# Patient Record
Sex: Male | Born: 1940 | Race: Black or African American | Hispanic: No | Marital: Married | Smoking: Never smoker
Health system: Southern US, Community
[De-identification: ages and names within clinical notes are randomized; demographics above are authoritative.]

## PROBLEM LIST (undated history)

## (undated) DIAGNOSIS — F0390 Unspecified dementia without behavioral disturbance: Secondary | ICD-10-CM

## (undated) DIAGNOSIS — E785 Hyperlipidemia, unspecified: Secondary | ICD-10-CM

## (undated) DIAGNOSIS — H269 Unspecified cataract: Secondary | ICD-10-CM

## (undated) DIAGNOSIS — H409 Unspecified glaucoma: Secondary | ICD-10-CM

## (undated) HISTORY — DX: Hyperlipidemia, unspecified: E78.5

## (undated) HISTORY — PX: HERNIA REPAIR: SHX51

## (undated) HISTORY — PX: CATARACT EXTRACTION: SUR2

## (undated) HISTORY — PX: COLONOSCOPY: SHX174

## (undated) HISTORY — DX: Unspecified glaucoma: H40.9

## (undated) HISTORY — DX: Unspecified cataract: H26.9

---

## 1997-07-27 ENCOUNTER — Inpatient Hospital Stay (HOSPITAL_COMMUNITY): Admission: EM | Admit: 1997-07-27 | Discharge: 1997-07-29 | Payer: Self-pay | Admitting: Emergency Medicine

## 1997-11-02 ENCOUNTER — Emergency Department (HOSPITAL_COMMUNITY): Admission: EM | Admit: 1997-11-02 | Discharge: 1997-11-02 | Payer: Self-pay | Admitting: Emergency Medicine

## 1999-02-13 ENCOUNTER — Emergency Department (HOSPITAL_COMMUNITY): Admission: EM | Admit: 1999-02-13 | Discharge: 1999-02-13 | Payer: Self-pay | Admitting: *Deleted

## 1999-02-13 ENCOUNTER — Encounter: Payer: Self-pay | Admitting: *Deleted

## 2001-06-24 ENCOUNTER — Encounter: Admission: RE | Admit: 2001-06-24 | Discharge: 2001-07-08 | Payer: Self-pay | Admitting: Family Medicine

## 2001-11-14 ENCOUNTER — Encounter: Admission: RE | Admit: 2001-11-14 | Discharge: 2001-11-14 | Payer: Self-pay | Admitting: Family Medicine

## 2001-11-14 ENCOUNTER — Encounter: Payer: Self-pay | Admitting: Family Medicine

## 2004-01-22 ENCOUNTER — Encounter: Admission: RE | Admit: 2004-01-22 | Discharge: 2004-01-22 | Payer: Self-pay | Admitting: Family Medicine

## 2004-04-29 ENCOUNTER — Ambulatory Visit: Payer: Self-pay | Admitting: Family Medicine

## 2004-06-16 ENCOUNTER — Ambulatory Visit: Payer: Self-pay | Admitting: Gastroenterology

## 2004-06-30 ENCOUNTER — Ambulatory Visit: Payer: Self-pay | Admitting: Gastroenterology

## 2004-09-14 ENCOUNTER — Ambulatory Visit (HOSPITAL_COMMUNITY): Admission: RE | Admit: 2004-09-14 | Discharge: 2004-09-14 | Payer: Self-pay | Admitting: Emergency Medicine

## 2004-09-14 ENCOUNTER — Emergency Department (HOSPITAL_COMMUNITY): Admission: EM | Admit: 2004-09-14 | Discharge: 2004-09-14 | Payer: Self-pay | Admitting: Emergency Medicine

## 2004-09-21 ENCOUNTER — Emergency Department (HOSPITAL_COMMUNITY): Admission: AD | Admit: 2004-09-21 | Discharge: 2004-09-21 | Payer: Self-pay | Admitting: Emergency Medicine

## 2004-10-02 ENCOUNTER — Ambulatory Visit: Payer: Self-pay | Admitting: Family Medicine

## 2004-10-13 ENCOUNTER — Ambulatory Visit: Payer: Self-pay | Admitting: Family Medicine

## 2005-04-13 ENCOUNTER — Encounter: Payer: Self-pay | Admitting: Family Medicine

## 2005-04-13 LAB — CONVERTED CEMR LAB

## 2005-11-13 ENCOUNTER — Ambulatory Visit: Payer: Self-pay | Admitting: Family Medicine

## 2005-11-20 ENCOUNTER — Ambulatory Visit: Payer: Self-pay | Admitting: Family Medicine

## 2005-11-30 ENCOUNTER — Ambulatory Visit: Payer: Self-pay | Admitting: Family Medicine

## 2006-11-12 ENCOUNTER — Encounter: Payer: Self-pay | Admitting: Family Medicine

## 2006-11-12 DIAGNOSIS — E785 Hyperlipidemia, unspecified: Secondary | ICD-10-CM | POA: Insufficient documentation

## 2006-11-12 DIAGNOSIS — J309 Allergic rhinitis, unspecified: Secondary | ICD-10-CM | POA: Insufficient documentation

## 2006-11-12 DIAGNOSIS — F528 Other sexual dysfunction not due to a substance or known physiological condition: Secondary | ICD-10-CM

## 2006-11-22 ENCOUNTER — Ambulatory Visit: Payer: Self-pay | Admitting: Family Medicine

## 2006-11-22 LAB — CONVERTED CEMR LAB
ALT: 23 units/L (ref 0–53)
AST: 27 units/L (ref 0–37)
Albumin: 3.8 g/dL (ref 3.5–5.2)
Alkaline Phosphatase: 40 units/L (ref 39–117)
BUN: 14 mg/dL (ref 6–23)
Basophils Absolute: 0 10*3/uL (ref 0.0–0.1)
Basophils Relative: 0.5 % (ref 0.0–1.0)
Bilirubin Urine: NEGATIVE
Bilirubin, Direct: 0.1 mg/dL (ref 0.0–0.3)
Blood in Urine, dipstick: NEGATIVE
CO2: 29 meq/L (ref 19–32)
Calcium: 8.9 mg/dL (ref 8.4–10.5)
Chloride: 105 meq/L (ref 96–112)
Cholesterol: 225 mg/dL (ref 0–200)
Creatinine, Ser: 0.9 mg/dL (ref 0.4–1.5)
Direct LDL: 159.1 mg/dL
Eosinophils Absolute: 0.1 10*3/uL (ref 0.0–0.6)
Eosinophils Relative: 2.5 % (ref 0.0–5.0)
GFR calc Af Amer: 109 mL/min
GFR calc non Af Amer: 90 mL/min
Glucose, Bld: 100 mg/dL — ABNORMAL HIGH (ref 70–99)
Glucose, Urine, Semiquant: NEGATIVE
HCT: 38.2 % — ABNORMAL LOW (ref 39.0–52.0)
HDL: 45.1 mg/dL (ref >39.0)
Hemoglobin: 13.4 g/dL (ref 13.0–17.0)
Ketones, urine, test strip: NEGATIVE
Lymphocytes Relative: 36.7 % (ref 12.0–46.0)
MCHC: 35.1 g/dL (ref 30.0–36.0)
MCV: 84.1 fL (ref 78.0–100.0)
Monocytes Absolute: 0.5 10*3/uL (ref 0.2–0.7)
Monocytes Relative: 10 % (ref 3.0–11.0)
Neutro Abs: 2.7 10*3/uL (ref 1.4–7.7)
Neutrophils Relative %: 50.3 % (ref 43.0–77.0)
Nitrite: NEGATIVE
PSA: 1.5 ng/mL (ref 0.10–4.00)
Platelets: 227 10*3/uL (ref 150–400)
Potassium: 4.2 meq/L (ref 3.5–5.1)
Protein, U semiquant: NEGATIVE
RBC: 4.54 M/uL (ref 4.22–5.81)
RDW: 13.1 % (ref 11.5–14.6)
Sodium: 140 meq/L (ref 135–145)
Specific Gravity, Urine: 1.02
TSH: 3.26 microintl units/mL (ref 0.35–5.50)
Total Bilirubin: 1.1 mg/dL (ref 0.3–1.2)
Total CHOL/HDL Ratio: 5
Total Protein: 6.8 g/dL (ref 6.0–8.3)
Triglycerides: 138 mg/dL (ref 0–149)
Urobilinogen, UA: 0.2
VLDL: 28 mg/dL (ref 0–40)
WBC Urine, dipstick: NEGATIVE
WBC: 5.2 10*3/uL (ref 4.5–10.5)
pH: 5.5

## 2006-12-07 ENCOUNTER — Ambulatory Visit: Payer: Self-pay | Admitting: Family Medicine

## 2007-12-06 ENCOUNTER — Ambulatory Visit: Payer: Self-pay | Admitting: Family Medicine

## 2007-12-06 LAB — CONVERTED CEMR LAB
ALT: 21 units/L (ref 0–53)
Basophils Absolute: 0 10*3/uL (ref 0.0–0.1)
Basophils Relative: 0.4 % (ref 0.0–3.0)
Bilirubin Urine: NEGATIVE
Blood in Urine, dipstick: NEGATIVE
Calcium: 9.4 mg/dL (ref 8.4–10.5)
Cholesterol: 266 mg/dL (ref 0–200)
Creatinine, Ser: 1 mg/dL (ref 0.4–1.5)
Direct LDL: 139.5 mg/dL
Eosinophils Absolute: 0.2 10*3/uL (ref 0.0–0.7)
GFR calc Af Amer: 96 mL/min
GFR calc non Af Amer: 79 mL/min
Glucose, Urine, Semiquant: NEGATIVE
HDL: 37.4 mg/dL — ABNORMAL LOW (ref >39.0)
Hemoglobin: 14.2 g/dL (ref 13.0–17.0)
Ketones, urine, test strip: NEGATIVE
MCHC: 35 g/dL (ref 30.0–36.0)
MCV: 85.9 fL (ref 78.0–100.0)
Neutro Abs: 3.7 10*3/uL (ref 1.4–7.7)
Nitrite: NEGATIVE
PSA: 1.9 ng/mL (ref 0.10–4.00)
Protein, U semiquant: NEGATIVE
RBC: 4.74 M/uL (ref 4.22–5.81)
RDW: 13.9 % (ref 11.5–14.6)
Sodium: 141 meq/L (ref 135–145)
Specific Gravity, Urine: 1.02
TSH: 2.87 microintl units/mL (ref 0.35–5.50)
Total Bilirubin: 1 mg/dL (ref 0.3–1.2)
Urobilinogen, UA: 0.2
WBC Urine, dipstick: NEGATIVE
pH: 5

## 2007-12-12 ENCOUNTER — Ambulatory Visit: Payer: Self-pay | Admitting: Family Medicine

## 2008-12-18 ENCOUNTER — Ambulatory Visit: Payer: Self-pay | Admitting: Family Medicine

## 2008-12-21 ENCOUNTER — Telehealth: Payer: Self-pay | Admitting: *Deleted

## 2009-02-25 ENCOUNTER — Ambulatory Visit: Payer: Self-pay | Admitting: Family Medicine

## 2009-02-25 LAB — CONVERTED CEMR LAB
ALT: 26 units/L (ref 0–53)
AST: 27 units/L (ref 0–37)
Bilirubin, Direct: 0.1 mg/dL (ref 0.0–0.3)
Total Bilirubin: 1.3 mg/dL — ABNORMAL HIGH (ref 0.3–1.2)

## 2009-03-04 ENCOUNTER — Ambulatory Visit: Payer: Self-pay | Admitting: Family Medicine

## 2009-05-22 ENCOUNTER — Encounter (INDEPENDENT_AMBULATORY_CARE_PROVIDER_SITE_OTHER): Payer: Self-pay | Admitting: *Deleted

## 2009-05-24 ENCOUNTER — Encounter (INDEPENDENT_AMBULATORY_CARE_PROVIDER_SITE_OTHER): Payer: Self-pay | Admitting: *Deleted

## 2009-06-18 ENCOUNTER — Encounter (INDEPENDENT_AMBULATORY_CARE_PROVIDER_SITE_OTHER): Payer: Self-pay | Admitting: *Deleted

## 2009-06-19 ENCOUNTER — Ambulatory Visit: Payer: Self-pay | Admitting: Gastroenterology

## 2009-07-03 ENCOUNTER — Ambulatory Visit: Payer: Self-pay | Admitting: Gastroenterology

## 2009-07-04 ENCOUNTER — Encounter: Payer: Self-pay | Admitting: Gastroenterology

## 2009-12-23 ENCOUNTER — Ambulatory Visit: Payer: Self-pay | Admitting: Family Medicine

## 2010-05-11 LAB — CONVERTED CEMR LAB
ALT: 21 units/L (ref 0–53)
ALT: 22 units/L (ref 0–53)
AST: 28 units/L (ref 0–37)
AST: 31 units/L (ref 0–37)
Albumin: 4.3 g/dL (ref 3.5–5.2)
Alkaline Phosphatase: 40 units/L (ref 39–117)
Alkaline Phosphatase: 44 units/L (ref 39–117)
BUN: 11 mg/dL (ref 6–23)
Basophils Absolute: 0 10*3/uL (ref 0.0–0.1)
Basophils Relative: 0.2 % (ref 0.0–3.0)
Basophils Relative: 0.9 % (ref 0.0–3.0)
Bilirubin Urine: NEGATIVE
Bilirubin, Direct: 0 mg/dL (ref 0.0–0.3)
Bilirubin, Direct: 0.2 mg/dL (ref 0.0–0.3)
CO2: 29 meq/L (ref 19–32)
Calcium: 9.4 mg/dL (ref 8.4–10.5)
Chloride: 104 meq/L (ref 96–112)
Chloride: 106 meq/L (ref 96–112)
Cholesterol: 238 mg/dL — ABNORMAL HIGH (ref 0–200)
Creatinine, Ser: 0.9 mg/dL (ref 0.4–1.5)
Creatinine, Ser: 1 mg/dL (ref 0.4–1.5)
Direct LDL: 172.7 mg/dL
Eosinophils Absolute: 0.1 10*3/uL (ref 0.0–0.7)
Eosinophils Relative: 1.7 % (ref 0.0–5.0)
Eosinophils Relative: 2 % (ref 0.0–5.0)
GFR calc non Af Amer: 108.22 mL/min (ref >60)
Glucose, Bld: 96 mg/dL (ref 70–99)
Glucose, Urine, Semiquant: NEGATIVE
HCT: 41.5 % (ref 39.0–52.0)
HDL: 49.7 mg/dL (ref >39.00)
Hemoglobin: 14.2 g/dL (ref 13.0–17.0)
LDL Cholesterol: 96 mg/dL (ref 0–99)
Lymphocytes Relative: 26 % (ref 12.0–46.0)
Lymphs Abs: 1.6 10*3/uL (ref 0.7–4.0)
MCHC: 34.2 g/dL (ref 30.0–36.0)
MCV: 86.9 fL (ref 78.0–100.0)
MCV: 87.3 fL (ref 78.0–100.0)
Monocytes Absolute: 0.4 10*3/uL (ref 0.1–1.0)
Monocytes Absolute: 0.6 10*3/uL (ref 0.1–1.0)
Monocytes Relative: 6.9 % (ref 3.0–12.0)
Monocytes Relative: 9.2 % (ref 3.0–12.0)
Neutro Abs: 4.2 10*3/uL (ref 1.4–7.7)
Neutrophils Relative %: 58.2 % (ref 43.0–77.0)
Neutrophils Relative %: 65.2 % (ref 43.0–77.0)
PSA: 1.5 ng/mL (ref 0.10–4.00)
PSA: 1.7 ng/mL (ref 0.10–4.00)
Platelets: 254 10*3/uL (ref 150.0–400.0)
Potassium: 4.1 meq/L (ref 3.5–5.1)
Potassium: 4.9 meq/L (ref 3.5–5.1)
RBC: 4.47 M/uL (ref 4.22–5.81)
RBC: 4.77 M/uL (ref 4.22–5.81)
RDW: 13.3 % (ref 11.5–14.6)
Sodium: 140 meq/L (ref 135–145)
Specific Gravity, Urine: 1.025
TSH: 1.52 microintl units/mL (ref 0.35–5.50)
Total Bilirubin: 1.6 mg/dL — ABNORMAL HIGH (ref 0.3–1.2)
Total CHOL/HDL Ratio: 3
Total CHOL/HDL Ratio: 5
Total Protein: 6.8 g/dL (ref 6.0–8.3)
Total Protein: 7.2 g/dL (ref 6.0–8.3)
Triglycerides: 121 mg/dL (ref 0.0–149.0)
Triglycerides: 85 mg/dL (ref 0.0–149.0)
Urobilinogen, UA: 0.2
VLDL: 24.2 mg/dL (ref 0.0–40.0)
WBC: 6 10*3/uL (ref 4.5–10.5)
WBC: 6.3 10*3/uL (ref 4.5–10.5)

## 2010-05-13 NOTE — Letter (Signed)
Summary: Patient Notice- Polyp Results  [facility]  [address] [place], [place] [address]   Phone: [phone]  Fax: [phone]        July 04, 2009 MRN: [id number]    Tony Leach [address] [place], [place]  [address]    Dear Mr. OLANDER,  I am pleased to inform you that the colon polyp(s) removed during your recent colonoscopy was (were) found to be benign (no cancer detected) upon pathologic examination.  I recommend you have a repeat colonoscopy examination in 5 years to look for recurrent polyps, as having colon polyps increases your risk for having recurrent polyps or even colon cancer in the future.  Should you develop new or worsening symptoms of abdominal pain, bowel habit changes or bleeding from the rectum or bowels, please schedule an evaluation with either your primary care physician or with me.  Continue treatment plan as outlined the day of your exam.  Please call us if you are having persistent problems or have questions about your condition that have not been fully answered at this time.  Sincerely,  Meryl Dare MD [facility]  This letter has been electronically signed by your physician.  Appended Document: Patient Notice- Polyp Results Letter mailed 3.25.11

## 2010-05-13 NOTE — Letter (Signed)
Summary: Colonoscopy Letter  [facility]  [address] [place], [place] [address]   Phone: [phone]  Fax: [phone]      May 22, 2009 MRN: [id number]   Tony Leach [address] [place], [place]  [address]   Dear [name],   According to your medical record, it is time for you to schedule a Colonoscopy. The American Cancer Society recommends this procedure as a method to detect early colon cancer. Patients with a family history of colon cancer, or a personal history of colon polyps or inflammatory bowel disease are at increased risk.  This letter has beeen generated based on the recommendations made at the time of your procedure. If you feel that in your particular situation this may no longer apply, please contact our office.  Please call our office at [phone] to schedule this appointment or to update your records at your earliest convenience.  Thank you for cooperating with [place] to provide you with the very best care possible.   Sincerely,  Judie Petit T. Russella Dar, M.D.  [facility] Gastroenterology Division [phone]

## 2010-05-13 NOTE — Procedures (Signed)
Summary: Colonoscopy  Patient: Tony Leach Note: All result statuses are Final unless otherwise noted.  Tests: (1) Colonoscopy (COL)   COL Colonoscopy           DONE     [facility]     [address]     [place], [place]  [address]           COLONOSCOPY PROCEDURE REPORT           PATIENT:  Tony Leach, Tony Leach  MR#:  [id number]     BIRTHDATE:  1940/07/08, 68 yrs. old  GENDER:  male           ENDOSCOPIST:  Judie Petit T. Russella Dar, MD, [facility]           PROCEDURE DATE:  07/03/2009     PROCEDURE:  Colonoscopy with snare polypectomy     ASA CLASS:  Class II     INDICATIONS:  1) follow-up of polyp  2) family history of colon     cancer, adenomatous polyp, 06/2004. Brother with colon cancer at     42. Screening.           MEDICATIONS:   Fentanyl 75 mcg IV, Versed 7 mg IV           DESCRIPTION OF PROCEDURE:   After the risks benefits and     alternatives of the procedure were thoroughly explained, informed     consent was obtained.  Digital rectal exam was performed and     revealed no abnormalities.   The LB [id number]-[id number] endoscope     was introduced through the anus and advanced to the cecum, which     was identified by both the appendix and ileocecal valve, without     limitations.  The quality of the prep was excellent, using     MoviPrep.  The instrument was then slowly withdrawn as the colon     was fully examined.     <<PROCEDUREIMAGES>>           FINDINGS:  A sessile polyp was found in the ascending colon. It     was 5 mm in size. Polyp was snared without cautery. Retrieval was     successful.  A sessile polyp was found in the descending colon. It     was 6 mm in size. Polyp was snared without cautery. Retrieval was     successful. A sessile polyp was found in the sigmoid colon. It was     4 mm in size. Polyp was snared without cautery. Retrieval was     successful. This was otherwise a normal examination of the colon.     Retroflexed views in the rectum revealed internal  hemorrhoids,     small. The time to cecum =  1.33  minutes. The scope was then     withdrawn (time =  10.5  min) from the patient and the procedure     completed.           COMPLICATIONS:  None           ENDOSCOPIC IMPRESSION:     1) 5 mm sessile polyp in the ascending colon     2) 6 mm sessile polyp in the descending colon     3) 4 mm sessile polyp in the sigmoid colon     4) Internal hemorrhoids           RECOMMENDATIONS:     1) Await pathology results  2) Repeat Colonoscopy in 5 years.           Venita Lick. Russella Dar, MD, [facility]           CC: Roderick Pee, MD           n.     Rosalie DoctorVenita Lick. Sible Straley at 07/03/2009 10:04 AM           Antoine Primas, [id number]  Note: An exclamation mark (!) indicates a result that was not dispersed into the flowsheet. Document Creation Date: 07/03/2009 10:04 AM _______________________________________________________________________  (1) Order result status: Final Collection or observation date-time: 07/03/2009 10:00 Requested date-time:  Receipt date-time:  Reported date-time:  Referring Physician:   Ordering Physician: Claudette Head [phone]) Specimen Source:  Source: Launa Grill Order Number: [id number] Lab site:   Appended Document: Colonoscopy     Procedures Next Due Date:    Colonoscopy: 06/2014

## 2010-05-13 NOTE — Letter (Signed)
Summary: Previsit letter  [facility]  [address] [place], [place] [address]   Phone: [phone]  Fax: [phone]       05/24/2009 MRN: [id number]  Tony Leach [address] [place], [place]  [address]  Dear Mr. PILLSBURY,  Welcome to the Gastroenterology Division at [facility].    You are scheduled to see a nurse for your pre-procedure visit on 06-19-09 at 9:30am on the 3rd floor at [facility], [address]  We ask that you try to arrive at our office 15 minutes prior to your appointment time to allow for check-in.  Your nurse visit will consist of discussing your medical and surgical history, your immediate family medical history, and your medications.    Please bring a complete list of all your medications or, if you prefer, bring the medication bottles and we will list them.  We will need to be aware of both prescribed and over the counter drugs.  We will need to know exact dosage information as well.  If you are on blood thinners (Coumadin, Plavix, Aggrenox, Ticlid, etc.) please call our office today/prior to your appointment, as we need to consult with your physician about holding your medication.   Please be prepared to read and sign documents such as consent forms, a financial agreement, and acknowledgement forms.  If necessary, and with your consent, a friend or relative is welcome to sit-in on the nurse visit with you.  Please bring your insurance card so that we may make a copy of it.  If your insurance requires a referral to see a specialist, please bring your referral form from your primary care physician.  No co-pay is required for this nurse visit.     If you cannot keep your appointment, please call [phone] to cancel or reschedule prior to your appointment date.  This allows [place] the opportunity to schedule an appointment for another patient in need of care.    Thank you for choosing [facility] for your medical needs.  We  appreciate the opportunity to care for you.  Please visit [place] at our website  to learn more about our practice.                     Sincerely.                                                                                                                   The Gastroenterology Division

## 2010-05-13 NOTE — Assessment & Plan Note (Signed)
Summary: pt will come in fasting/njr   Vital Signs:  Patient profile:   70 year old male Height:      58.5 inches Weight:      170 pounds BMI:     35.05 Temp:     97.7 degrees F oral BP sitting:   110 / 74  (right arm) Cuff size:   regular  Vitals Entered By: Kathrynn Speed CMA (December 23, 2009 8:20 AM) CC: annual exam, fasting, src Is Patient Diabetic? No   CC:  annual exam, fasting, and src.  History of Present Illness:  Tony Leach is a 70 year old male, who comes in today for evaluation of hyperlipidemia, and erectile dysfunction, and general physical examination  He takes simvastatin 10 mg nightly for hyperlipidemia.  Will check lipid panel today.  He takes V  50 mg dose one half tab p.r.n. for erectile dysfunction.  He also takes an 81-mg baby aspirin and eyedrops to the ophthalmologist.  He gets routine eye care.  No dental care.  Colonoscopy 2011 normal, tetanus, 2000, Pneumovax 2005, declines a flu shot Here for Medicare AWV:  1.   Risk factors based on Past M, S, F history:...reviewed in detail.  No change 2.   Physical Activities: walks daily 3.   Depression/mood: good mood.  No depression 4.   Hearing: normal 5.   ADL's: normal functioning at home 6.   Fall Risk: we did none 7.   Home Safety: reviewed.  No guns in the house 8.   Height, weight, &visual acuity:height weight, normal.  Vision normal.  The ophthalmologist 9.   Counseling: continue exercise daily, and medications 10.   Labs ordered based on risk factors: done today 11.           Referral Coordination........none indicated 12.           Care Plan.........Marland Kitchenreviewed medications the patient again declined flu shot 13.            Cognitive Assessment ...Marland KitchenMarland KitchenMarland Kitchenoriented x 3.  Cognitive function, normal  Preventive Screening-Counseling & Management  Alcohol-Tobacco     Smoking Status: never  Current Medications (verified): 1)  Aspirin 81 Mg  Tbec (Aspirin) .... Once Daily 2)  Cosopt 2-0.5 %  Soln  (Dorzolamide-Timolol) .... One Drop Two Times A Day Left Eye 3)  Mens Multivitamin Plus   Tabs (Multiple Vitamins-Minerals) .... Two Times A Day 4)  Viagra 50 Mg Tabs (Sildenafil Citrate) .... Uad 5)  Simvastatin 10 Mg Tabs (Simvastatin) .... Take One Tab At Bedtime  Allergies (verified): No Known Drug Allergies  Past History:  Past medical, surgical, family and social histories (including risk factors) reviewed, and no changes noted (except as noted below).  Past Medical History: Reviewed history from 11/12/2006 and no changes required. Allergic rhinitis Asthma Hyperlipidemia erectile dysfunction  Past Surgical History: Reviewed history from 11/12/2006 and no changes required. Inguinal herniorrhaphy R  Family History: Reviewed history and no changes required.  Social History: Reviewed history from 12/12/2007 and no changes required. Retired Never Smoked Alcohol use-no Drug use-no Regular exercise-yes  Review of Systems      See HPI  Physical Exam  General:  Well-developed,well-nourished,in no acute distress; alert,appropriate and cooperative throughout examination Head:  Normocephalic and atraumatic without obvious abnormalities. No apparent alopecia or balding. Eyes:  No corneal or conjunctival inflammation noted. EOMI. Perrla. Funduscopic exam benign, without hemorrhages, exudates or papilledema. Vision grossly normal. Ears:  External ear exam shows no significant lesions or deformities.  Otoscopic examination reveals clear  canals, tympanic membranes are intact bilaterally without bulging, retraction, inflammation or discharge. Hearing is grossly normal bilaterally. Nose:  External nasal examination shows no deformity or inflammation. Nasal mucosa are pink and moist without lesions or exudates. Mouth:  Oral mucosa and oropharynx without lesions or exudates.  Teeth in good repair. Neck:  No deformities, masses, or tenderness noted. Chest Wall:  No deformities, masses,  tenderness or gynecomastia noted. Breasts:  No masses or gynecomastia noted Lungs:  Normal respiratory effort, chest expands symmetrically. Lungs are clear to auscultation, no crackles or wheezes. Heart:  Normal rate and regular rhythm. S1 and S2 normal without gallop, murmur, click, rub or other extra sounds. Abdomen:  Bowel sounds positive,abdomen soft and non-tender without masses, organomegaly or hernias noted. Rectal:  No external abnormalities noted. Normal sphincter tone. No rectal masses or tenderness. Genitalia:  Testes bilaterally descended without nodularity, tenderness or masses. No scrotal masses or lesions. No penis lesions or urethral discharge. Prostate:  Prostate gland firm and smooth, no enlargement, nodularity, tenderness, mass, asymmetry or induration. Msk:  No deformity or scoliosis noted of thoracic or lumbar spine.   Pulses:  R and L carotid,radial,femoral,dorsalis pedis and posterior tibial pulses are full and equal bilaterally Extremities:  No clubbing, cyanosis, edema, or deformity noted with normal full range of motion of all joints.   Neurologic:  No cranial nerve deficits noted. Station and gait are normal. Plantar reflexes are down-going bilaterally. DTRs are symmetrical throughout. Sensory, motor and coordinative functions appear intact. Skin:  Intact without suspicious lesions or rashes Cervical Nodes:  No lymphadenopathy noted Axillary Nodes:  No palpable lymphadenopathy Inguinal Nodes:  No significant adenopathy Psych:  Cognition and judgment appear intact. Alert and cooperative with normal attention span and concentration. No apparent delusions, illusions, hallucinations   Impression & Recommendations:  Problem # 1:  HEALTH SCREENING (ICD-V70.0) Assessment Unchanged  Orders: EKG w/ Interpretation ([address]) Venipuncture ([address]) TLB-Lipid Panel (80061-LIPID) TLB-BMP (Basic Metabolic Panel-BMET) (80048-METABOL) TLB-CBC Platelet - w/Differential  (85025-CBCD) TLB-Hepatic/Liver Function Pnl (80076-HEPATIC) TLB-TSH (Thyroid Stimulating Hormone) (84443-TSH) TLB-PSA (Prostate Specific Antigen) (84153-PSA) Prescription Created Electronically [phone]) Medicare -1st Annual Wellness Visit [phone]) Urinalysis-dipstick only (Medicare patient) (91478GN) Specimen Handling ([address])  Problem # 2:  ERECTILE DYSFUNCTION (ICD-302.72) Assessment: Improved  His updated medication list for this problem includes:    Viagra 50 Mg Tabs (Sildenafil citrate) ..... Uad  Orders: Venipuncture ([address]) TLB-Lipid Panel (80061-LIPID) TLB-BMP (Basic Metabolic Panel-BMET) (80048-METABOL) TLB-CBC Platelet - w/Differential (85025-CBCD) TLB-Hepatic/Liver Function Pnl (80076-HEPATIC) TLB-TSH (Thyroid Stimulating Hormone) (84443-TSH) TLB-PSA (Prostate Specific Antigen) (84153-PSA) Prescription Created Electronically [phone]) Medicare -1st Annual Wellness Visit [phone]) Urinalysis-dipstick only (Medicare patient) (41324MW) Specimen Handling ([address])  Problem # 3:  HYPERLIPIDEMIA (ICD-272.4) Assessment: Improved  His updated medication list for this problem includes:    Simvastatin 10 Mg Tabs (Simvastatin) .Marland Kitchen... Take one tab at bedtime  Orders: EKG w/ Interpretation (93000) Venipuncture (53664) TLB-Lipid Panel (80061-LIPID) TLB-BMP (Basic Metabolic Panel-BMET) (80048-METABOL) TLB-CBC Platelet - w/Differential (85025-CBCD) TLB-Hepatic/Liver Function Pnl (80076-HEPATIC) TLB-TSH (Thyroid Stimulating Hormone) (84443-TSH) TLB-PSA (Prostate Specific Antigen) (84153-PSA) Prescription Created Electronically [phone]) Medicare -1st Annual Wellness Visit [phone]) Urinalysis-dipstick only (Medicare patient) (63875IE) Specimen Handling ([address]95)  Complete Medication List: 1)  Aspirin 81 Mg Tbec (Aspirin) .... Once daily 2)  Cosopt 2-0.5 % Soln (Dorzolamide-timolol) .... One drop two times a day left eye 3)  Mens Multivitamin Plus Tabs (Multiple vitamins-minerals) ....  Two times a day 4)  Viagra 50 Mg Tabs (Sildenafil citrate) .... Uad 5)  Simvastatin 10 Mg Tabs (Simvastatin) .... Take  one tab at bedtime  Patient Instructions: 1)  Please schedule a follow-up appointment in 1 year. Prescriptions: SIMVASTATIN 10 MG TABS (SIMVASTATIN) take one tab at bedtime  #100 x 3   Entered and Authorized by:   Roderick Pee MD   Signed by:   Roderick Pee MD on 12/23/2009   Method used:   Electronically to        [facility]       [address]       [place], [place]  [address]       Ph: [phone]       Fax: [phone]   RxID:   [id number] VIAGRA 50 MG TABS (SILDENAFIL CITRATE) UAD  #6 x 11   Entered and Authorized by:   Roderick Pee MD   Signed by:   Roderick Pee MD on 12/23/2009   Method used:   Electronically to        [facility]       [address]       [place], [place]  [address]       Ph: [phone]       Fax: [phone]   RxID:   [id number]     Appended Document: Orders Update    Clinical Lists Changes  Observations: Added new observation of COMMENTS: Wynona Canes, CMA  December 23, 2009 11:48 AM  (12/23/2009 11:47) Added new observation of PH URINE: 5.0  (12/23/2009 11:47) Added new observation of SPEC GR URIN: 1.025  (12/23/2009 11:47) Added new observation of APPEARANCE U: Clear  (12/23/2009 11:47) Added new observation of UA COLOR: yellow  (12/23/2009 11:47) Added new observation of WBC DIPSTK U: negative  (12/23/2009 11:47) Added new observation of NITRITE URN: negative  (12/23/2009 11:47) Added new observation of UROBILINOGEN: 0.2  (12/23/2009 11:47) Added new observation of PROTEIN, URN: negative  (12/23/2009 11:47) Added new observation of BLOOD UR DIP: negative  (12/23/2009 11:47) Added new observation of KETONES URN: negative  (12/23/2009 11:47) Added new observation of BILIRUBIN UR: negative  (12/23/2009  11:47) Added new observation of GLUCOSE, URN: negative  (12/23/2009 11:47)      Laboratory Results   Urine Tests  Date/Time Recieved: December 23, 2009 11:48 AM  Date/Time Reported: December 23, 2009 11:48 AM   Routine Urinalysis   Color: yellow Appearance: Clear Glucose: negative   (Normal Range: Negative) Bilirubin: negative   (Normal Range: Negative) Ketone: negative   (Normal Range: Negative) Spec. Gravity: 1.025   (Normal Range: 1.003-1.035) Blood: negative   (Normal Range: Negative) pH: 5.0   (Normal Range: 5.0-8.0) Protein: negative   (Normal Range: Negative) Urobilinogen: 0.2   (Normal Range: 0-1) Nitrite: negative   (Normal Range: Negative) Leukocyte Esterace: negative   (Normal Range: Negative)    Comments: Wynona Canes, CMA  December 23, 2009 11:48 AM

## 2010-05-13 NOTE — Miscellaneous (Signed)
Summary: [facility]  Clinical Lists Changes  Medications: Added new medication of MOVIPREP 100 GM  SOLR (PEG-KCL-NACL-NASULF-NA ASC-C) As per prep instructions. - Signed Rx of MOVIPREP 100 GM  SOLR (PEG-KCL-NACL-NASULF-NA ASC-C) As per prep instructions.;  #1 x 0;  Signed;  Entered by: Ezra Sites RN;  Authorized by: Meryl Dare MD [facility];  Method used: Electronically to [facility] [address], [place], [place], [place]  [address], Ph: [phone], Fax: [phone] Observations: Added new observation of NKA: T (06/19/2009 9:02)    Prescriptions: MOVIPREP 100 GM  SOLR (PEG-KCL-NACL-NASULF-NA ASC-C) As per prep instructions.  #1 x 0   Entered by:   Ezra Sites RN   Authorized by:   Meryl Dare MD [facility]   Signed by:   Ezra Sites RN on 06/19/2009   Method used:   Electronically to        [facility]       [address]       [place], [place]  [address]       Ph: [phone]       Fax: [phone]   RxID:   [id number]

## 2010-05-13 NOTE — Letter (Signed)
Summary: [facility] Instructions  [facility]  [address] [place], [place] [address]   Phone: [phone]  Fax: [phone]       Tony Leach    March 23, 1941    MRN: [id number]        Procedure Day Dorna Bloom:  Wednesday  07/03/09     Arrival Time:  8:30am     Procedure Time:  9:30am     Location of Procedure:                    _X _  [facility] (4th Floor)                        PREPARATION FOR COLONOSCOPY WITH MOVIPREP   Starting 5 days prior to your procedure  Friday 03/18  do not eat nuts, seeds, popcorn, corn, beans, peas,  salads, or any raw vegetables.  Do not take any fiber supplements (e.g. Metamucil, Citrucel, and Benefiber).  THE DAY BEFORE YOUR PROCEDURE         DATE: 03/22   DAY: Tuesday  1.  Drink clear liquids the entire day-NO SOLID FOOD  2.  Do not drink anything colored red or purple.  Avoid juices with pulp.  No orange juice.  3.  Drink at least 64 oz. (8 glasses) of fluid/clear liquids during the day to prevent dehydration and help the prep work efficiently.  CLEAR LIQUIDS INCLUDE: Water Jello Ice Popsicles Tea (sugar ok, no milk/cream) Powdered fruit flavored drinks Coffee (sugar ok, no milk/cream) Gatorade Juice: apple, white grape, white cranberry  Lemonade Clear bullion, consomm, broth Carbonated beverages (any kind) Strained chicken noodle soup Hard Candy                             4.  In the morning, mix first dose of MoviPrep solution:    Empty 1 Pouch A and 1 Pouch B into the disposable container    Add lukewarm drinking water to the top line of the container. Mix to dissolve    Refrigerate (mixed solution should be used within 24 hrs)  5.  Begin drinking the prep at 5:00 p.m. The MoviPrep container is divided by 4 marks.   Every 15 minutes drink the solution down to the next mark (approximately 8 oz) until the full liter is complete.   6.  Follow completed prep with 16 oz of clear liquid of your  choice (Nothing red or purple).  Continue to drink clear liquids until bedtime.  7.  Before going to bed, mix second dose of MoviPrep solution:    Empty 1 Pouch A and 1 Pouch B into the disposable container    Add lukewarm drinking water to the top line of the container. Mix to dissolve    Refrigerate  THE DAY OF YOUR PROCEDURE      DATE: 03/23  DAY: Wednesday  Beginning at  4:30 a.m. (5 hours before procedure):         1. Every 15 minutes, drink the solution down to the next mark (approx 8 oz) until the full liter is complete.  2. Follow completed prep with 16 oz. of clear liquid of your choice.    3. You may drink clear liquids until 7:30am  (2 HOURS BEFORE PROCEDURE).   MEDICATION INSTRUCTIONS  Unless otherwise instructed, you should take regular prescription medications with a small sip of water  as early as possible the morning of your procedure.        OTHER INSTRUCTIONS  You will need a responsible adult at least 69 years of age to accompany you and drive you home.   This person must remain in the waiting room during your procedure.  Wear loose fitting clothing that is easily removed.  Leave jewelry and other valuables at home.  However, you may wish to bring a book to read or  an iPod/MP3 player to listen to music as you wait for your procedure to start.  Remove all body piercing jewelry and leave at home.  Total time from sign-in until discharge is approximately 2-3 hours.  You should go home directly after your procedure and rest.  You can resume normal activities the  day after your procedure.  The day of your procedure you should not:   Drive   Make legal decisions   Operate machinery   Drink alcohol   Return to work  You will receive specific instructions about eating, activities and medications before you leave.    The above instructions have been reviewed and explained to me by   Ezra Sites RN  June 19, 2009 9:16 AM     I fully  understand and can verbalize these instructions _____________________________ Date _________

## 2010-05-13 NOTE — Assessment & Plan Note (Signed)
Summary: emp/pt coming in fasting/jls   Vital Signs:  Patient Profile:   70 Years Old Male Height:     58.5 inches Weight:      175 pounds Temp:     97.8 degrees F oral Pulse rate:   58 / minute Pulse rhythm:   regular BP sitting:   124 / 80  (left arm) Cuff size:   regular  Vitals Entered By: Kern Reap CMA (December 12, 2007 10:30 AM)                 Chief Complaint:  cpx.  History of Present Illness: Tony Leach is a 70 year old, retired male, who comes in today for physical evaluation because of a history of hyperlipidemia and erectile dysfunction.  Erectile dysfunction.  Uses fiber 50 mg one half tablet, p.r.n., it's working well.  The hyperlipidemia is minute by diet, exercise, his weight is currently 175, height 5-foot 8 1/2 inches, and he walks 30 to 45 minutes daily.  He also takes an 81-mg baby aspirin.  He gets his eyes checked every 6 months.  No dental care, because he has no dental insurance: : Check was 2006 tetanus 08.  pneumonia vaccine0 5.  He states he doesn't get the flu shot.  He was advised to do that    Prior Medication List:  ASPIRIN 81 MG  TBEC (ASPIRIN) once daily COSOPT 2-0.5 %  SOLN (DORZOLAMIDE-TIMOLOL) ONE DROP two times a day LEFT EYE MENS MULTIVITAMIN PLUS   TABS (MULTIPLE VITAMINS-MINERALS) two times a day VIAGRA 100 MG  TABS (SILDENAFIL CITRATE) uad   Current Allergies: No known allergies   Past Medical History:    Reviewed history from 11/12/2006 and no changes required:       Allergic rhinitis       Asthma       Hyperlipidemia       erectile dysfunction   Family History:    Reviewed history and no changes required:  Social History:    Reviewed history and no changes required:       Retired       Never Smoked       Alcohol use-no       Drug use-no       Regular exercise-yes   Risk Factors:  Tobacco use:  never Drug use:  no Alcohol use:  no Exercise:  yes   Review of Systems      See HPI   Physical  Exam  General:     Well-developed,well-nourished,in no acute distress; alert,appropriate and cooperative throughout examination Head:     Normocephalic and atraumatic without obvious abnormalities. No apparent alopecia or balding. Eyes:     No corneal or conjunctival inflammation noted. EOMI. Perrla. Funduscopic exam benign, without hemorrhages, exudates or papilledema. Vision grossly normal. Ears:     External ear exam shows no significant lesions or deformities.  Otoscopic examination reveals clear canals, tympanic membranes are intact bilaterally without bulging, retraction, inflammation or discharge. Hearing is grossly normal bilaterally. Nose:     External nasal examination shows no deformity or inflammation. Nasal mucosa are pink and moist without lesions or exudates. Mouth:     Oral mucosa and oropharynx without lesions or exudates.  Teeth in good repair. Neck:     No deformities, masses, or tenderness noted. Chest Wall:     No deformities, masses, tenderness or gynecomastia noted. Breasts:     No masses or gynecomastia noted Lungs:     Normal respiratory effort, chest  expands symmetrically. Lungs are clear to auscultation, no crackles or wheezes. Heart:     Normal rate and regular rhythm. S1 and S2 normal without gallop, murmur, click, rub or other extra sounds. Abdomen:     Bowel sounds positive,abdomen soft and non-tender without masses, organomegaly or hernias noted. Rectal:     No external abnormalities noted. Normal sphincter tone. No rectal masses or tenderness. Genitalia:     Testes bilaterally descended without nodularity, tenderness or masses. No scrotal masses or lesions. No penis lesions or urethral discharge. Prostate:     Prostate gland firm and smooth, no enlargement, nodularity, tenderness, mass, asymmetry or induration. Msk:     No deformity or scoliosis noted of thoracic or lumbar spine.   Pulses:     R and L carotid,radial,femoral,dorsalis pedis and  posterior tibial pulses are full and equal bilaterally Extremities:     No clubbing, cyanosis, edema, or deformity noted with normal full range of motion of all joints.   Neurologic:     No cranial nerve deficits noted. Station and gait are normal. Plantar reflexes are down-going bilaterally. DTRs are symmetrical throughout. Sensory, motor and coordinative functions appear intact. Skin:     Intact without suspicious lesions or rashes Cervical Nodes:     No lymphadenopathy noted Axillary Nodes:     No palpable lymphadenopathy Inguinal Nodes:     No significant adenopathy Psych:     Cognition and judgment appear intact. Alert and cooperative with normal attention span and concentration. No apparent delusions, illusions, hallucinations    Impression & Recommendations:  Problem # 1:  ERECTILE DYSFUNCTION (ICD-302.72) Assessment: Improved  The following medications were removed from the medication list:    Viagra 100 Mg Tabs (Sildenafil citrate) ..... Uad  His updated medication list for this problem includes:    Viagra 50 Mg Tabs (Sildenafil citrate) ..... Uad   Complete Medication List: 1)  Aspirin 81 Mg Tbec (Aspirin) .... Once daily 2)  Cosopt 2-0.5 % Soln (Dorzolamide-timolol) .... One drop two times a day left eye 3)  Mens Multivitamin Plus Tabs (Multiple vitamins-minerals) .... Two times a day 4)  Viagra 50 Mg Tabs (Sildenafil citrate) .... Uad  Other Orders: EKG w/ Interpretation (93000)   Patient Instructions: 1)  Please schedule a follow-up appointment in 1 year. 2)  It is important that you exercise regularly at least 20 minutes 5 times a week. If you develop chest pain, have severe difficulty breathing, or feel very tired , stop exercising immediately and seek medical attention. 3)  Take an Aspirin every day.   Prescriptions: VIAGRA 50 MG TABS (SILDENAFIL CITRATE) UAD  #6 x 11   Entered and Authorized by:   Roderick Pee MD   Signed by:   Roderick Pee MD on  12/12/2007   Method used:   Electronically to        [facility]       [address]       [place], [place]  [address]       Ph: [phone]       Fax: [phone]   RxID:   [id number]  ]

## 2010-12-25 ENCOUNTER — Encounter: Payer: Self-pay | Admitting: Family Medicine

## 2010-12-25 ENCOUNTER — Ambulatory Visit (INDEPENDENT_AMBULATORY_CARE_PROVIDER_SITE_OTHER): Payer: Medicare Other | Admitting: Family Medicine

## 2010-12-25 DIAGNOSIS — Z0000 Encounter for general adult medical examination without abnormal findings: Secondary | ICD-10-CM

## 2010-12-25 DIAGNOSIS — E785 Hyperlipidemia, unspecified: Secondary | ICD-10-CM

## 2010-12-25 DIAGNOSIS — F528 Other sexual dysfunction not due to a substance or known physiological condition: Secondary | ICD-10-CM

## 2010-12-25 LAB — POCT URINALYSIS DIPSTICK
Blood, UA: NEGATIVE
Glucose, UA: NEGATIVE
Nitrite, UA: NEGATIVE
Protein, UA: NEGATIVE
Spec Grav, UA: 1.025
Urobilinogen, UA: 2

## 2010-12-25 LAB — HEPATIC FUNCTION PANEL
ALT: 19 U/L (ref 0–53)
AST: 26 U/L (ref 0–37)
Alkaline Phosphatase: 46 U/L (ref 39–117)

## 2010-12-25 LAB — CBC WITH DIFFERENTIAL/PLATELET
Basophils Relative: 0.6 % (ref 0.0–3.0)
Eosinophils Absolute: 0.1 10*3/uL (ref 0.0–0.7)
HCT: 40.8 % (ref 39.0–52.0)
Hemoglobin: 13.7 g/dL (ref 13.0–17.0)
Lymphs Abs: 1.5 10*3/uL (ref 0.7–4.0)
MCHC: 33.6 g/dL (ref 30.0–36.0)
MCV: 87.2 fl (ref 78.0–100.0)
Monocytes Absolute: 0.5 10*3/uL (ref 0.1–1.0)
Neutro Abs: 3.1 10*3/uL (ref 1.4–7.7)
RBC: 4.69 Mil/uL (ref 4.22–5.81)

## 2010-12-25 LAB — BASIC METABOLIC PANEL
BUN: 18 mg/dL (ref 6–23)
GFR: 132.76 mL/min (ref >60.00)
Potassium: 4.5 mEq/L (ref 3.5–5.1)
Sodium: 139 mEq/L (ref 135–145)

## 2010-12-25 LAB — PSA: PSA: 1.48 ng/mL (ref 0.10–4.00)

## 2010-12-25 LAB — TSH: TSH: 2.07 u[IU]/mL (ref 0.35–5.50)

## 2010-12-25 LAB — LIPID PANEL
LDL Cholesterol: 109 mg/dL — ABNORMAL HIGH (ref 0–99)
Total CHOL/HDL Ratio: 3
VLDL: 13.4 mg/dL (ref 0.0–40.0)

## 2010-12-25 MED ORDER — SILDENAFIL CITRATE 100 MG PO TABS: 100.0000 mg | ORAL_TABLET | ORAL | Status: DC | PRN

## 2010-12-25 MED ORDER — SIMVASTATIN 10 MG PO TABS: 10.0000 mg | ORAL_TABLET | Freq: Every day | ORAL | Status: DC

## 2010-12-25 NOTE — Progress Notes (Signed)
  Subjective:    Patient ID: Tony Leach, male    DOB: 1940-10-26, 70 y.o.   MRN: [id number]  HPILawrence is a 70 year old, married man, nonsmoker and comes in today for Medicare wellness examination because of a history of allergic rhinitis, asthma, erectile dysfunction, and hyperlipidemia.  He takes simvastatin 10 mg nightly and aspirin tablet for hyperlipidemia.  Will check lipid panel today.  He uses fiber and 50 mg p.r.n., however, doesn't seem to be working very well.  Will increase the dose 200 mg.  He has a history of underlying allergic rhinitis and asthma.  He declines the shingles, flu and Pneumovax.  Tetanus booster 2008.  He gets routine eye care and vision is normal with his glasses.  Hearing normal, regular dental care, colonoscopy, normal.  GI, activities of daily living.  He walks on a regular basis.  Cognitive function, normal.  Home health safety reviewed.  No issues identified.  No guns in the house.  He does have a healthcare power of attorney and living will.    Review of Systems  Constitutional: Negative.   HENT: Negative.   Eyes: Negative.   Respiratory: Negative.   Cardiovascular: Negative.   Gastrointestinal: Negative.   Genitourinary: Negative.   Musculoskeletal: Negative.   Skin: Negative.   Neurological: Negative.   Hematological: Negative.   Psychiatric/Behavioral: Negative.        Objective:   Physical Exam  Constitutional: He is oriented to person, place, and time. He appears well-developed and well-nourished.  HENT:  Head: Normocephalic and atraumatic.  Right Ear: External ear normal.  Left Ear: External ear normal.  Nose: Nose normal.  Mouth/Throat: Oropharynx is clear and moist.  Eyes: Conjunctivae and EOM are normal. Pupils are equal, round, and reactive to light.  Neck: Normal range of motion. Neck supple. No JVD present. No tracheal deviation present. No thyromegaly present.  Cardiovascular: Normal rate, regular rhythm, normal heart  sounds and intact distal pulses.  Exam reveals no gallop and no friction rub.   No murmur heard. Pulmonary/Chest: Effort normal and breath sounds normal. No stridor. No respiratory distress. He has no wheezes. He has no rales. He exhibits no tenderness.  Abdominal: Soft. Bowel sounds are normal. He exhibits no distension and no mass. There is no tenderness. There is no rebound and no guarding.  Genitourinary: Rectum normal, prostate normal and penis normal. Guaiac negative stool. No penile tenderness.  Musculoskeletal: Normal range of motion. He exhibits no edema and no tenderness.  Lymphadenopathy:    He has no cervical adenopathy.  Neurological: He is alert and oriented to person, place, and time. He has normal reflexes. No cranial nerve deficit. He exhibits normal muscle tone.  Skin: Skin is warm and dry. No rash noted. No erythema. No pallor.  Psychiatric: He has a normal mood and affect. His behavior is normal. Judgment and thought content normal.          Assessment & Plan:  Healthy male.  Hyperlipidemia.  Continue simvastatin, and aspirin.  Check lipid panel.  Erectile dysfunction.  Increase fiber from 50 mg a day to 100 mg a day  Return in one year, sooner if any problems.  Again recommended the flu shot and Pneumovax.  Patient declined.  He understands these diseases can cause death and and are easily prevented with vaccinations.  He still declines

## 2010-12-25 NOTE — Patient Instructions (Signed)
Continued the simvastatin.  Increase the Viagra to 100 mg p.r.n.  Return in one year or sooner if any problems.  Again, I would recommend the vaccinations, that we discussed think about it.................[facility] very seriously.  I

## 2011-09-15 DIAGNOSIS — H2510 Age-related nuclear cataract, unspecified eye: Secondary | ICD-10-CM | POA: Diagnosis not present

## 2011-09-15 DIAGNOSIS — H40059 Ocular hypertension, unspecified eye: Secondary | ICD-10-CM | POA: Diagnosis not present

## 2011-12-28 ENCOUNTER — Encounter: Payer: Self-pay | Admitting: Family Medicine

## 2011-12-28 ENCOUNTER — Ambulatory Visit (INDEPENDENT_AMBULATORY_CARE_PROVIDER_SITE_OTHER): Payer: Medicare Other | Admitting: Family Medicine

## 2011-12-28 VITALS — BP 120/80 | Temp 98.1°F | Ht 68.0 in | Wt 170.0 lb

## 2011-12-28 DIAGNOSIS — Z0000 Encounter for general adult medical examination without abnormal findings: Secondary | ICD-10-CM | POA: Diagnosis not present

## 2011-12-28 DIAGNOSIS — N139 Obstructive and reflux uropathy, unspecified: Secondary | ICD-10-CM

## 2011-12-28 DIAGNOSIS — N401 Enlarged prostate with lower urinary tract symptoms: Secondary | ICD-10-CM | POA: Diagnosis not present

## 2011-12-28 DIAGNOSIS — E785 Hyperlipidemia, unspecified: Secondary | ICD-10-CM

## 2011-12-28 DIAGNOSIS — F528 Other sexual dysfunction not due to a substance or known physiological condition: Secondary | ICD-10-CM

## 2011-12-28 LAB — CBC WITH DIFFERENTIAL/PLATELET
Basophils Absolute: 0 10*3/uL (ref 0.0–0.1)
Basophils Relative: 0.6 % (ref 0.0–3.0)
Eosinophils Absolute: 0.1 10*3/uL (ref 0.0–0.7)
Hemoglobin: 12.5 g/dL — ABNORMAL LOW (ref 13.0–17.0)
Lymphocytes Relative: 29.9 % (ref 12.0–46.0)
Lymphs Abs: 1.5 10*3/uL (ref 0.7–4.0)
MCHC: 32.8 g/dL (ref 30.0–36.0)
MCV: 86.3 fl (ref 78.0–100.0)
Monocytes Absolute: 0.4 10*3/uL (ref 0.1–1.0)
Neutro Abs: 2.9 10*3/uL (ref 1.4–7.7)
RBC: 4.4 Mil/uL (ref 4.22–5.81)
RDW: 14.6 % (ref 11.5–14.6)

## 2011-12-28 LAB — HEPATIC FUNCTION PANEL
Albumin: 4 g/dL (ref 3.5–5.2)
Total Protein: 6.6 g/dL (ref 6.0–8.3)

## 2011-12-28 LAB — POCT URINALYSIS DIPSTICK
Protein, UA: NEGATIVE
Spec Grav, UA: 1.02
Urobilinogen, UA: 1

## 2011-12-28 LAB — BASIC METABOLIC PANEL
BUN: 11 mg/dL (ref 6–23)
CO2: 25 mEq/L (ref 19–32)
Calcium: 9.2 mg/dL (ref 8.4–10.5)
Creatinine, Ser: 0.9 mg/dL (ref 0.4–1.5)
Glucose, Bld: 93 mg/dL (ref 70–99)

## 2011-12-28 LAB — LIPID PANEL
Cholesterol: 167 mg/dL (ref 0–200)
LDL Cholesterol: 98 mg/dL (ref 0–99)
VLDL: 11.8 mg/dL (ref 0.0–40.0)

## 2011-12-28 LAB — TSH: TSH: 1.96 u[IU]/mL (ref 0.35–5.50)

## 2011-12-28 MED ORDER — SILDENAFIL CITRATE 100 MG PO TABS: 100.0000 mg | ORAL_TABLET | ORAL | Status: DC | PRN

## 2011-12-28 MED ORDER — SIMVASTATIN 10 MG PO TABS: 10.0000 mg | ORAL_TABLET | Freq: Every day | ORAL | Status: DC

## 2011-12-28 NOTE — Progress Notes (Signed)
  Subjective:    Patient ID: Tony Leach, male    DOB: [birth year], 71 y.o.   MRN: [id number]  HPI Kervens is a 71 year old male nonsmoker who comes in today for a Medicare wellness examination  He has a history of hyperlipidemia he takes simvastatin 10 mg daily along with an aspirin tablet  He uses eyedrops for glaucoma  He uses Viagra when necessary 3D  He gets routine eye care, dental care, colonoscopy and GI, tetanus 2008, he declines a flu shot Pneumovax and shingles  Cognitive function normal he walks on a regular basis home health safety reviewed no issues identified, no guns in the house, he does have a health care power of attorney and living well   Review of Systems  Constitutional: Negative.   HENT: Negative.   Eyes: Negative.   Respiratory: Negative.   Cardiovascular: Negative.   Gastrointestinal: Negative.   Genitourinary: Negative.   Musculoskeletal: Negative.   Skin: Negative.   Neurological: Negative.   Hematological: Negative.   Psychiatric/Behavioral: Negative.        Objective:   Physical Exam  Constitutional: He is oriented to person, place, and time. He appears well-developed and well-nourished.  HENT:  Head: Normocephalic and atraumatic.  Right Ear: External ear normal.  Left Ear: External ear normal.  Nose: Nose normal.  Mouth/Throat: Oropharynx is clear and moist.  Eyes: Conjunctivae normal and EOM are normal. Pupils are equal, round, and reactive to light.  Neck: Normal range of motion. Neck supple. No JVD present. No tracheal deviation present. No thyromegaly present.  Cardiovascular: Normal rate, regular rhythm, normal heart sounds and intact distal pulses.  Exam reveals no gallop and no friction rub.   No murmur heard. Pulmonary/Chest: Effort normal and breath sounds normal. No stridor. No respiratory distress. He has no wheezes. He has no rales. He exhibits no tenderness.  Abdominal: Soft. Bowel sounds are normal. He exhibits no  distension and no mass. There is no tenderness. There is no rebound and no guarding.  Genitourinary: Rectum normal, prostate normal and penis normal. Guaiac negative stool. No penile tenderness.  Musculoskeletal: Normal range of motion. He exhibits no edema and no tenderness.  Lymphadenopathy:    He has no cervical adenopathy.  Neurological: He is alert and oriented to person, place, and time. He has normal reflexes. No cranial nerve deficit. He exhibits normal muscle tone.  Skin: Skin is warm and dry. No rash noted. No erythema. No pallor.  Psychiatric: He has a normal mood and affect. His behavior is normal. Judgment and thought content normal.          Assessment & Plan:  Healthy male  Hyperlipidemia continue Zocor and aspirin check labs  History of gout continue eyedrops see ophthalmologist  Again encouraged vaccinations

## 2011-12-28 NOTE — Patient Instructions (Signed)
Continue your current excellent health habits  Strongly consider a flu shot Pneumovax and shingles vaccine  Return in one year sooner if any problem

## 2012-03-22 DIAGNOSIS — H2510 Age-related nuclear cataract, unspecified eye: Secondary | ICD-10-CM | POA: Diagnosis not present

## 2012-03-22 DIAGNOSIS — H40019 Open angle with borderline findings, low risk, unspecified eye: Secondary | ICD-10-CM | POA: Diagnosis not present

## 2012-03-22 DIAGNOSIS — H2010 Chronic iridocyclitis, unspecified eye: Secondary | ICD-10-CM | POA: Diagnosis not present

## 2012-05-13 ENCOUNTER — Telehealth: Payer: Self-pay | Admitting: Family Medicine

## 2012-05-13 NOTE — Telephone Encounter (Signed)
Pt's wife would like to be established as a NEW patient with you. She has medicare. (no supplement) She was discharged from the hospital on Friday, 05/13/12. with anemia. Pls advise

## 2012-05-17 NOTE — Telephone Encounter (Signed)
Okay,,,,,,,,,,,, have her come in next Monday for new patient evaluation 30 minute appointment

## 2012-09-20 DIAGNOSIS — H40059 Ocular hypertension, unspecified eye: Secondary | ICD-10-CM | POA: Diagnosis not present

## 2012-09-20 DIAGNOSIS — H2010 Chronic iridocyclitis, unspecified eye: Secondary | ICD-10-CM | POA: Diagnosis not present

## 2012-09-20 DIAGNOSIS — H2510 Age-related nuclear cataract, unspecified eye: Secondary | ICD-10-CM | POA: Diagnosis not present

## 2012-12-27 ENCOUNTER — Encounter: Payer: PRIVATE HEALTH INSURANCE | Admitting: Family Medicine

## 2013-01-24 ENCOUNTER — Other Ambulatory Visit: Payer: Self-pay | Admitting: Family Medicine

## 2013-03-06 ENCOUNTER — Encounter: Payer: PRIVATE HEALTH INSURANCE | Admitting: Family Medicine

## 2013-03-20 ENCOUNTER — Ambulatory Visit (INDEPENDENT_AMBULATORY_CARE_PROVIDER_SITE_OTHER): Payer: Medicare Other | Admitting: Family Medicine

## 2013-03-20 ENCOUNTER — Encounter: Payer: Self-pay | Admitting: Family Medicine

## 2013-03-20 VITALS — BP 130/80 | Temp 97.6°F | Ht 67.75 in | Wt 173.0 lb

## 2013-03-20 DIAGNOSIS — F528 Other sexual dysfunction not due to a substance or known physiological condition: Secondary | ICD-10-CM

## 2013-03-20 DIAGNOSIS — E785 Hyperlipidemia, unspecified: Secondary | ICD-10-CM

## 2013-03-20 DIAGNOSIS — J309 Allergic rhinitis, unspecified: Secondary | ICD-10-CM | POA: Diagnosis not present

## 2013-03-20 DIAGNOSIS — Z125 Encounter for screening for malignant neoplasm of prostate: Secondary | ICD-10-CM

## 2013-03-20 DIAGNOSIS — N401 Enlarged prostate with lower urinary tract symptoms: Secondary | ICD-10-CM | POA: Diagnosis not present

## 2013-03-20 LAB — POCT URINALYSIS DIPSTICK
Bilirubin, UA: NEGATIVE
Blood, UA: NEGATIVE
Ketones, UA: NEGATIVE
Leukocytes, UA: NEGATIVE
Spec Grav, UA: 1.02
Urobilinogen, UA: 2
pH, UA: 6

## 2013-03-20 LAB — LIPID PANEL
Cholesterol: 220 mg/dL — ABNORMAL HIGH (ref 0–200)
HDL: 50.2 mg/dL (ref >39.00)
Triglycerides: 56 mg/dL (ref 0.0–149.0)

## 2013-03-20 LAB — BASIC METABOLIC PANEL
BUN: 11 mg/dL (ref 6–23)
CO2: 26 mEq/L (ref 19–32)
Calcium: 9.1 mg/dL (ref 8.4–10.5)
Chloride: 106 mEq/L (ref 96–112)
Creatinine, Ser: 0.9 mg/dL (ref 0.4–1.5)
GFR: 106.88 mL/min (ref >60.00)
Glucose, Bld: 87 mg/dL (ref 70–99)
Sodium: 140 mEq/L (ref 135–145)

## 2013-03-20 LAB — PSA: PSA: 1.58 ng/mL (ref 0.10–4.00)

## 2013-03-20 LAB — CBC WITH DIFFERENTIAL/PLATELET
Basophils Absolute: 0 10*3/uL (ref 0.0–0.1)
Basophils Relative: 0.6 % (ref 0.0–3.0)
Eosinophils Relative: 2.4 % (ref 0.0–5.0)
HCT: 38.7 % — ABNORMAL LOW (ref 39.0–52.0)
Hemoglobin: 13.1 g/dL (ref 13.0–17.0)
Lymphs Abs: 1.3 10*3/uL (ref 0.7–4.0)
MCV: 84.7 fl (ref 78.0–100.0)
Monocytes Absolute: 0.5 10*3/uL (ref 0.1–1.0)
Monocytes Relative: 9.3 % (ref 3.0–12.0)
Neutro Abs: 2.9 10*3/uL (ref 1.4–7.7)
Platelets: 270 10*3/uL (ref 150.0–400.0)
RBC: 4.56 Mil/uL (ref 4.22–5.81)
RDW: 14.6 % (ref 11.5–14.6)
WBC: 4.9 10*3/uL (ref 4.5–10.5)

## 2013-03-20 LAB — HEPATIC FUNCTION PANEL
Albumin: 4.1 g/dL (ref 3.5–5.2)
Bilirubin, Direct: 0.1 mg/dL (ref 0.0–0.3)
Total Protein: 7 g/dL (ref 6.0–8.3)

## 2013-03-20 MED ORDER — SILDENAFIL CITRATE 100 MG PO TABS: ORAL_TABLET | ORAL | Status: DC

## 2013-03-20 MED ORDER — SIMVASTATIN 10 MG PO TABS: 10.0000 mg | ORAL_TABLET | Freq: Every day | ORAL | Status: DC

## 2013-03-20 NOTE — Progress Notes (Signed)
Pre visit review using our clinic review tool, if applicable. No additional management support is needed unless otherwise documented below in the visit note. 

## 2013-03-20 NOTE — Progress Notes (Signed)
   Subjective:    Patient ID: Tony Leach, male    DOB: Mar 14, 1941, 72 y.o.   MRN: [id number]  HPI Tony Leach is a 72 year old married male nonsmoker who comes in today for a Medicare wellness examination because of a history of underlying glaucoma hyperlipidemia and erectile dysfunction  Med list reviewed it was accurate  He gets routine eye care, no dental care, colonoscopy and GI, he declines a flu shot and a Pneumovax  Cognitive function normal he walks on a regular basis home health safety reviewed no issues identified, no guns in the house, he does have a health care power of attorney and living well  He sees his ophthalmologist on a regular basis because he has glaucoma and uses eyedrops   Review of Systems  Constitutional: Negative.   HENT: Negative.   Eyes: Negative.   Respiratory: Negative.   Cardiovascular: Negative.   Gastrointestinal: Negative.   Endocrine: Negative.   Genitourinary: Negative.   Musculoskeletal: Negative.   Skin: Negative.   Allergic/Immunologic: Negative.   Neurological: Negative.   Hematological: Negative.   Psychiatric/Behavioral: Negative.        Objective:   Physical Exam  Nursing note and vitals reviewed. Constitutional: He is oriented to person, place, and time. He appears well-developed and well-nourished.  HENT:  Head: Normocephalic and atraumatic.  Right Ear: External ear normal.  Left Ear: External ear normal.  Nose: Nose normal.  Mouth/Throat: Oropharynx is clear and moist.  Eyes: Conjunctivae and EOM are normal. Pupils are equal, round, and reactive to light.  Neck: Normal range of motion. Neck supple. No JVD present. No tracheal deviation present. No thyromegaly present.  Cardiovascular: Normal rate, regular rhythm, normal heart sounds and intact distal pulses.  Exam reveals no gallop and no friction rub.   No murmur heard. Pulmonary/Chest: Effort normal and breath sounds normal. No stridor. No respiratory distress. He has  no wheezes. He has no rales. He exhibits no tenderness.  Abdominal: Soft. Bowel sounds are normal. He exhibits no distension and no mass. There is no tenderness. There is no rebound and no guarding.  Genitourinary: Rectum normal, prostate normal and penis normal. Guaiac negative stool. No penile tenderness.  Musculoskeletal: Normal range of motion. He exhibits no edema and no tenderness.  Lymphadenopathy:    He has no cervical adenopathy.  Neurological: He is alert and oriented to person, place, and time. He has normal reflexes. No cranial nerve deficit. He exhibits normal muscle tone.  Skin: Skin is warm and dry. No rash noted. No erythema. No pallor.  Psychiatric: He has a normal mood and affect. His behavior is normal. Judgment and thought content normal.          Assessment & Plan:  Healthy male  History of glaucoma continue eyedrops and followup by ophthalmology  Hyperlipidemia continue simvastatin 10 mg each bedtime and an aspirin tablet check labs  Erectile dysfunction continue Viagra when necessary

## 2013-03-20 NOTE — Patient Instructions (Signed)
Continue your current medications  Follow-up in 1 year sooner if any problem 

## 2013-03-23 DIAGNOSIS — H2589 Other age-related cataract: Secondary | ICD-10-CM | POA: Diagnosis not present

## 2013-03-23 DIAGNOSIS — H20029 Recurrent acute iridocyclitis, unspecified eye: Secondary | ICD-10-CM | POA: Diagnosis not present

## 2013-03-23 DIAGNOSIS — H40059 Ocular hypertension, unspecified eye: Secondary | ICD-10-CM | POA: Diagnosis not present

## 2013-03-27 ENCOUNTER — Telehealth: Payer: Self-pay | Admitting: Family Medicine

## 2013-03-27 NOTE — Telephone Encounter (Signed)
Patient is aware of lab results.

## 2013-03-27 NOTE — Telephone Encounter (Signed)
Pt is call back blood work results

## 2013-09-21 DIAGNOSIS — H40059 Ocular hypertension, unspecified eye: Secondary | ICD-10-CM | POA: Diagnosis not present

## 2013-09-21 DIAGNOSIS — H40019 Open angle with borderline findings, low risk, unspecified eye: Secondary | ICD-10-CM | POA: Diagnosis not present

## 2013-09-21 DIAGNOSIS — H2589 Other age-related cataract: Secondary | ICD-10-CM | POA: Diagnosis not present

## 2013-12-29 ENCOUNTER — Encounter: Payer: Self-pay | Admitting: Gastroenterology

## 2014-03-22 ENCOUNTER — Encounter: Payer: Self-pay | Admitting: Family Medicine

## 2014-03-22 ENCOUNTER — Ambulatory Visit (INDEPENDENT_AMBULATORY_CARE_PROVIDER_SITE_OTHER): Payer: Medicare Other | Admitting: Family Medicine

## 2014-03-22 VITALS — BP 120/80 | Temp 97.6°F | Ht 69.0 in | Wt 176.0 lb

## 2014-03-22 DIAGNOSIS — R351 Nocturia: Secondary | ICD-10-CM

## 2014-03-22 DIAGNOSIS — E785 Hyperlipidemia, unspecified: Secondary | ICD-10-CM | POA: Diagnosis not present

## 2014-03-22 DIAGNOSIS — N401 Enlarged prostate with lower urinary tract symptoms: Secondary | ICD-10-CM

## 2014-03-22 DIAGNOSIS — R413 Other amnesia: Secondary | ICD-10-CM

## 2014-03-22 DIAGNOSIS — F528 Other sexual dysfunction not due to a substance or known physiological condition: Secondary | ICD-10-CM

## 2014-03-22 LAB — POCT URINALYSIS DIPSTICK
Bilirubin, UA: NEGATIVE
Blood, UA: NEGATIVE
Glucose, UA: NEGATIVE
KETONES UA: NEGATIVE
Leukocytes, UA: NEGATIVE
Nitrite, UA: NEGATIVE
PH UA: 7
Protein, UA: NEGATIVE
SPEC GRAV UA: 1.02
Urobilinogen, UA: 2

## 2014-03-22 LAB — BASIC METABOLIC PANEL
BUN: 13 mg/dL (ref 6–23)
CHLORIDE: 101 meq/L (ref 96–112)
CO2: 26 mEq/L (ref 19–32)
Calcium: 9.1 mg/dL (ref 8.4–10.5)
Creatinine, Ser: 0.9 mg/dL (ref 0.4–1.5)
GFR: 102.62 mL/min (ref >60.00)
Glucose, Bld: 79 mg/dL (ref 70–99)
Potassium: 4 mEq/L (ref 3.5–5.1)
Sodium: 134 mEq/L — ABNORMAL LOW (ref 135–145)

## 2014-03-22 LAB — CBC WITH DIFFERENTIAL/PLATELET
BASOS PCT: 0.6 % (ref 0.0–3.0)
Basophils Absolute: 0 10*3/uL (ref 0.0–0.1)
EOS PCT: 2.9 % (ref 0.0–5.0)
Eosinophils Absolute: 0.2 10*3/uL (ref 0.0–0.7)
HEMATOCRIT: 40.3 % (ref 39.0–52.0)
HEMOGLOBIN: 13.3 g/dL (ref 13.0–17.0)
LYMPHS ABS: 1.5 10*3/uL (ref 0.7–4.0)
Lymphocytes Relative: 23.3 % (ref 12.0–46.0)
MCHC: 33.1 g/dL (ref 30.0–36.0)
MCV: 85 fl (ref 78.0–100.0)
MONO ABS: 0.5 10*3/uL (ref 0.1–1.0)
MONOS PCT: 8.5 % (ref 3.0–12.0)
Neutro Abs: 4.1 10*3/uL (ref 1.4–7.7)
Neutrophils Relative %: 64.7 % (ref 43.0–77.0)
PLATELETS: 270 10*3/uL (ref 150.0–400.0)
RBC: 4.74 Mil/uL (ref 4.22–5.81)
RDW: 14.8 % (ref 11.5–15.5)
WBC: 6.3 10*3/uL (ref 4.0–10.5)

## 2014-03-22 LAB — HEPATIC FUNCTION PANEL
ALT: 19 U/L (ref 0–53)
AST: 26 U/L (ref 0–37)
Albumin: 4 g/dL (ref 3.5–5.2)
Alkaline Phosphatase: 45 U/L (ref 39–117)
BILIRUBIN DIRECT: 0.2 mg/dL (ref 0.0–0.3)
Total Bilirubin: 1.2 mg/dL (ref 0.2–1.2)
Total Protein: 6.9 g/dL (ref 6.0–8.3)

## 2014-03-22 LAB — LIPID PANEL
CHOL/HDL RATIO: 5
CHOLESTEROL: 240 mg/dL — AB (ref 0–200)
HDL: 50.9 mg/dL (ref >39.00)
LDL Cholesterol: 169 mg/dL — ABNORMAL HIGH (ref 0–99)
NONHDL: 189.1
Triglycerides: 103 mg/dL (ref 0.0–149.0)
VLDL: 20.6 mg/dL (ref 0.0–40.0)

## 2014-03-22 LAB — VITAMIN B12: VITAMIN B 12: 371 pg/mL (ref 211–911)

## 2014-03-22 LAB — PSA: PSA: 2.35 ng/mL (ref 0.10–4.00)

## 2014-03-22 LAB — TSH: TSH: 2.16 u[IU]/mL (ref 0.35–4.50)

## 2014-03-22 MED ORDER — SIMVASTATIN 10 MG PO TABS: 10.0000 mg | ORAL_TABLET | Freq: Every day | ORAL | Status: DC

## 2014-03-22 MED ORDER — SILDENAFIL CITRATE 20 MG PO TABS: 20.0000 mg | ORAL_TABLET | Freq: Three times a day (TID) | ORAL | Status: DC

## 2014-03-22 NOTE — Patient Instructions (Signed)
We will get you set up for neurologic evaluation ASAP to evaluate the short-term memory changes that you've noticed  Revatio.......[facility] generic Viagra  Zocor 10 mg..... One of bedtime  Walk 30 minutes daily

## 2014-03-22 NOTE — Progress Notes (Signed)
   Subjective:    Patient ID: Tony Leach, male    DOB: 04-06-1941, 73 y.o.   MRN: [id number]  HPI Tony Leach is a 73 year old married male nonsmoker who comes in today for evaluation of hyperlipidemia erectile dysfunction and a new problem 1 year of short-term memory loss  He does not drink or smoke but is noticed in the past year is having trouble short-term memory. He can't remember to take his Zocor at bedtime.  He is on eyedrops from his ophthalmologist for glaucoma, Viagra when necessary for ED, simvastatin 10 mg nightly which he no longer remember to take.  He gets routine eye care, no dental care,,,,, only has a few teeth left,,,,,, referred to Tony Leach DDS,,,,, vaccinations updated by Tony Leach colonoscopy 2011 normal  Cognitive function normal except for the years history of decreasing short-term memory, home health safety reviewed no issues identified, no guns in the house, he does not have a healthcare power of attorney nor living well. Encouraged to do that   Review of Systems  Constitutional: Negative.   HENT: Negative.   Eyes: Negative.   Respiratory: Negative.   Cardiovascular: Negative.   Gastrointestinal: Negative.   Endocrine: Negative.   Genitourinary: Negative.   Musculoskeletal: Negative.   Skin: Negative.   Allergic/Immunologic: Negative.   Neurological: Negative.   Hematological: Negative.   Psychiatric/Behavioral: Negative.        Objective:   Physical Exam  Constitutional: He is oriented to person, place, and time. He appears well-developed and well-nourished.  HENT:  Head: Normocephalic and atraumatic.  Right Ear: External ear normal.  Left Ear: External ear normal.  Nose: Nose normal.  Mouth/Throat: Oropharynx is clear and moist.  Eyes: Conjunctivae and EOM are normal. Pupils are equal, round, and reactive to light.  Neck: Normal range of motion. Neck supple. No JVD present. No tracheal deviation present. No thyromegaly present.  Cardiovascular:  Normal rate, regular rhythm, normal heart sounds and intact distal pulses.  Exam reveals no gallop and no friction rub.   No murmur heard. No carotid nor aortic bruits  Pulmonary/Chest: Effort normal and breath sounds normal. No stridor. No respiratory distress. He has no wheezes. He has no rales. He exhibits no tenderness.  Abdominal: Soft. Bowel sounds are normal. He exhibits no distension and no mass. There is no tenderness. There is no rebound and no guarding.  Ventral hernia  Genitourinary: Rectum normal, prostate normal and penis normal. Guaiac negative stool. No penile tenderness.  Musculoskeletal: Normal range of motion. He exhibits no edema or tenderness.  Lymphadenopathy:    He has no cervical adenopathy.  Neurological: He is alert and oriented to person, place, and time. He has normal reflexes. No cranial nerve deficit. He exhibits normal muscle tone.  Skin: Skin is warm and dry. No rash noted. No erythema. No pallor.  Psychiatric: He has a normal mood and affect. His behavior is normal. Judgment and thought content normal.  Nursing note and vitals reviewed.         Assessment & Plan:  Healthy male  Glaucoma,,,,,,, continue eyedrops and follow-up by Tony Leach  History of erectile dysfunction,,,,,,, Viagra when necessary,,,,,,,,, switched to the generic  Hyperlipidemia Zocor 10 mg daily at bedtime,,,,,,, check lipid panel since he's been off his medication  Short-term memory loss,,,,,, neuro consult for evaluation

## 2014-03-22 NOTE — Progress Notes (Signed)
Pre visit review using our clinic review tool, if applicable. No additional management support is needed unless otherwise documented below in the visit note. 

## 2014-03-23 ENCOUNTER — Other Ambulatory Visit: Payer: Self-pay | Admitting: *Deleted

## 2014-03-23 DIAGNOSIS — H20023 Recurrent acute iridocyclitis, bilateral: Secondary | ICD-10-CM | POA: Diagnosis not present

## 2014-03-23 DIAGNOSIS — H40052 Ocular hypertension, left eye: Secondary | ICD-10-CM | POA: Diagnosis not present

## 2014-03-23 DIAGNOSIS — H5703 Miosis: Secondary | ICD-10-CM | POA: Diagnosis not present

## 2014-03-23 DIAGNOSIS — H40013 Open angle with borderline findings, low risk, bilateral: Secondary | ICD-10-CM | POA: Diagnosis not present

## 2014-03-23 DIAGNOSIS — H2513 Age-related nuclear cataract, bilateral: Secondary | ICD-10-CM | POA: Diagnosis not present

## 2014-03-23 MED ORDER — SILDENAFIL CITRATE 50 MG PO TABS: 50.0000 mg | ORAL_TABLET | Freq: Every day | ORAL | Status: DC | PRN

## 2014-04-23 ENCOUNTER — Other Ambulatory Visit: Payer: Self-pay | Admitting: Family Medicine

## 2014-06-11 ENCOUNTER — Encounter: Payer: Self-pay | Admitting: Gastroenterology

## 2014-06-21 ENCOUNTER — Ambulatory Visit (AMBULATORY_SURGERY_CENTER): Payer: Self-pay | Admitting: *Deleted

## 2014-06-21 VITALS — Ht 68.5 in | Wt 186.8 lb

## 2014-06-21 DIAGNOSIS — Z86010 Personal history of colonic polyps: Secondary | ICD-10-CM

## 2014-06-21 MED ORDER — MOVIPREP 100 G PO SOLR: ORAL | Status: DC

## 2014-06-21 NOTE — Progress Notes (Signed)
No egg or soy allergy  No anesthesia or intubation problems per pt  No diet medications taken  Registered in EMMI   

## 2014-07-05 ENCOUNTER — Encounter: Payer: Self-pay | Admitting: Gastroenterology

## 2014-07-05 ENCOUNTER — Ambulatory Visit (AMBULATORY_SURGERY_CENTER): Payer: Medicare Other | Admitting: Gastroenterology

## 2014-07-05 VITALS — BP 124/83 | HR 66 | Temp 96.4°F | Resp 19 | Ht 68.0 in | Wt 186.0 lb

## 2014-07-05 DIAGNOSIS — Z86010 Personal history of colonic polyps: Secondary | ICD-10-CM | POA: Diagnosis not present

## 2014-07-05 DIAGNOSIS — J45909 Unspecified asthma, uncomplicated: Secondary | ICD-10-CM | POA: Diagnosis not present

## 2014-07-05 DIAGNOSIS — D122 Benign neoplasm of ascending colon: Secondary | ICD-10-CM

## 2014-07-05 DIAGNOSIS — Z800 Family history of malignant neoplasm of digestive organs: Secondary | ICD-10-CM

## 2014-07-05 MED ORDER — SODIUM CHLORIDE 0.9 % IV SOLN: 500.0000 mL | INTRAVENOUS | Status: DC

## 2014-07-05 NOTE — Patient Instructions (Signed)
Discharge instructions given. Handouts on polyps,diverticulosis and hemorrhoids. Resume previous medications. YOU HAD AN ENDOSCOPIC PROCEDURE TODAY AT THE  ENDOSCOPY CENTER:   Refer to the procedure report that was given to you for any specific questions about what was found during the examination.  If the procedure report does not answer your questions, please call your gastroenterologist to clarify.  If you requested that your care partner not be given the details of your procedure findings, then the procedure report has been included in a sealed envelope for you to review at your convenience later.  YOU SHOULD EXPECT: Some feelings of bloating in the abdomen. Passage of more gas than usual.  Walking can help get rid of the air that was put into your GI tract during the procedure and reduce the bloating. If you had a lower endoscopy (such as a colonoscopy or flexible sigmoidoscopy) you may notice spotting of blood in your stool or on the toilet paper. If you underwent a bowel prep for your procedure, you may not have a normal bowel movement for a few days.  Please Note:  You might notice some irritation and congestion in your nose or some drainage.  This is from the oxygen used during your procedure.  There is no need for concern and it should clear up in a day or so.  SYMPTOMS TO REPORT IMMEDIATELY:   Following lower endoscopy (colonoscopy or flexible sigmoidoscopy):  Excessive amounts of blood in the stool  Significant tenderness or worsening of abdominal pains  Swelling of the abdomen that is new, acute  Fever of 100F or higher   For urgent or emergent issues, a gastroenterologist can be reached at any hour by calling [phone].   DIET: Your first meal following the procedure should be a small meal and then it is ok to progress to your normal diet. Heavy or fried foods are harder to digest and may make you feel nauseous or bloated.  Likewise, meals heavy in dairy and  vegetables can increase bloating.  Drink plenty of fluids but you should avoid alcoholic beverages for 24 hours.  ACTIVITY:  You should plan to take it easy for the rest of today and you should NOT DRIVE or use heavy machinery until tomorrow (because of the sedation medicines used during the test).    FOLLOW UP: Our staff will call the number listed on your records the next business day following your procedure to check on you and address any questions or concerns that you may have regarding the information given to you following your procedure. If we do not reach you, we will leave a message.  However, if you are feeling well and you are not experiencing any problems, there is no need to return our call.  We will assume that you have returned to your regular daily activities without incident.  If any biopsies were taken you will be contacted by phone or by letter within the next 1-3 weeks.  Please call us at [phone] if you have not heard about the biopsies in 3 weeks.    SIGNATURES/CONFIDENTIALITY: You and/or your care partner have signed paperwork which will be entered into your electronic medical record.  These signatures attest to the fact that that the information above on your After Visit Summary has been reviewed and is understood.  Full responsibility of the confidentiality of this discharge information lies with you and/or your care-partner. 

## 2014-07-05 NOTE — Progress Notes (Signed)
To recovery, report to McCoy, RN, VSS 

## 2014-07-05 NOTE — Progress Notes (Signed)
Called to room to assist during endoscopic procedure.  Patient ID and intended procedure confirmed with present staff. Received instructions for my participation in the procedure from the performing physician.  

## 2014-07-05 NOTE — Op Note (Addendum)
[facility]  [address] [place], [id number]   COLONOSCOPY PROCEDURE REPORT  PATIENT: Tony Leach, Tony Leach  MR#: [id number] BIRTHDATE: May 28, 1940 , 73  yrs. old GENDER: male ENDOSCOPIST: Ladene Artist, MD, [facility] PROCEDURE DATE:  07/05/2014 PROCEDURE:   Colonoscopy, surveillance and Colonoscopy with snare polypectomy First Screening Colonoscopy - Avg.  risk and is 50 yrs.  old or older - No.  Prior Negative Screening - Now for repeat screening. N/A  History of Adenoma - Now for follow-up colonoscopy & has been > or = to 3 yrs.  N/A ASA CLASS:   Class II INDICATIONS:Surveillance due to prior colonic neoplasia and PH Colon Adenoma. MEDICATIONS: Monitored anesthesia care and Propofol 170 mg IV DESCRIPTION OF PROCEDURE:   After the risks benefits and alternatives of the procedure were thoroughly explained, informed consent was obtained.  The digital rectal exam revealed no abnormalities of the rectum.   The LB [id number]  endoscope was introduced through the anus and advanced to the cecum, which was identified by both the appendix and ileocecal valve. No adverse events experienced.   The quality of the prep was excellent. (MoviPrep was used)  The instrument was then slowly withdrawn as the colon was fully examined.    COLON FINDINGS: A sessile polyp measuring 5 mm in size was found in the ascending colon.  A polypectomy was performed with a cold snare.  The resection was complete, the polyp tissue was completely retrieved and sent to histology.   There was mild diverticulosis noted in the descending colon, sigmoid colon, ascending colon, and transverse colon.   The examination was otherwise normal. Retroflexed views revealed internal Grade I hemorrhoids. The time to cecum = 2 Withdrawal time = 8.4   The scope was withdrawn and the procedure completed. COMPLICATIONS: There were no immediate complications.  ENDOSCOPIC IMPRESSION: 1.   Sessile polyp in the  ascending colon; polypectomy performed with a cold snare 2.   Mild diverticulosis was noted in the descending colon, sigmoid colon, ascending colon, and transverse colon 3.   Grade l internal hemorrhoids  RECOMMENDATIONS: 1.  Await pathology results 2.  High fiber diet with liberal fluid intake. 3.  Repeat Colonoscopy in 5 years.  eSigned:  Ladene Artist, MD, [facility] 07/05/2014 9:55 AM

## 2014-07-09 ENCOUNTER — Telehealth: Payer: Self-pay | Admitting: *Deleted

## 2014-07-09 NOTE — Telephone Encounter (Signed)
Voicemail stopped midway through,unable to leave message on f/u callback

## 2014-07-13 ENCOUNTER — Encounter: Payer: Self-pay | Admitting: Gastroenterology

## 2014-09-21 DIAGNOSIS — H25813 Combined forms of age-related cataract, bilateral: Secondary | ICD-10-CM | POA: Diagnosis not present

## 2014-09-21 DIAGNOSIS — H40053 Ocular hypertension, bilateral: Secondary | ICD-10-CM | POA: Diagnosis not present

## 2015-03-28 DIAGNOSIS — H25813 Combined forms of age-related cataract, bilateral: Secondary | ICD-10-CM | POA: Diagnosis not present

## 2015-03-28 DIAGNOSIS — H40053 Ocular hypertension, bilateral: Secondary | ICD-10-CM | POA: Diagnosis not present

## 2015-05-20 ENCOUNTER — Emergency Department (HOSPITAL_COMMUNITY)
Admission: EM | Admit: 2015-05-20 | Discharge: 2015-05-20 | Disposition: A | Discharge status: Home / Self Care | Payer: Medicare Other | Attending: Emergency Medicine | Admitting: Emergency Medicine

## 2015-05-20 ENCOUNTER — Emergency Department (HOSPITAL_COMMUNITY): Payer: Medicare Other

## 2015-05-20 ENCOUNTER — Encounter (HOSPITAL_COMMUNITY): Payer: Self-pay | Admitting: Cardiology

## 2015-05-20 DIAGNOSIS — M25551 Pain in right hip: Secondary | ICD-10-CM | POA: Diagnosis not present

## 2015-05-20 DIAGNOSIS — Z79899 Other long term (current) drug therapy: Secondary | ICD-10-CM | POA: Insufficient documentation

## 2015-05-20 DIAGNOSIS — M79604 Pain in right leg: Secondary | ICD-10-CM

## 2015-05-20 DIAGNOSIS — H409 Unspecified glaucoma: Secondary | ICD-10-CM | POA: Diagnosis not present

## 2015-05-20 DIAGNOSIS — E785 Hyperlipidemia, unspecified: Secondary | ICD-10-CM | POA: Diagnosis not present

## 2015-05-20 DIAGNOSIS — Z7982 Long term (current) use of aspirin: Secondary | ICD-10-CM | POA: Diagnosis not present

## 2015-05-20 LAB — D-DIMER, QUANTITATIVE: D-Dimer, Quant: 0.37 ug/mL-FEU (ref 0.00–0.50)

## 2015-05-20 MED ORDER — ACETAMINOPHEN 500 MG PO TABS: 500.0000 mg | ORAL_TABLET | Freq: Four times a day (QID) | ORAL | Status: DC | PRN

## 2015-05-20 MED ORDER — KETOROLAC TROMETHAMINE 30 MG/ML IJ SOLN
30.0000 mg | Freq: Once | INTRAMUSCULAR | Status: AC
  Administered 2015-05-20: 30 mg via INTRAMUSCULAR
  Filled 2015-05-20: qty 1

## 2015-05-20 NOTE — ED Notes (Signed)
MD at bedside. 

## 2015-05-20 NOTE — Discharge Instructions (Signed)
As discussed, your evaluation today has been largely reassuring.  But, it is important that you monitor your condition carefully, and do not hesitate to return to the ED if you develop new, or concerning changes in your condition. ° °Otherwise, please follow-up with your physician for appropriate ongoing care. ° °Cryotherapy °Cryotherapy means treatment with cold. Ice or gel packs can be used to reduce both pain and swelling. Ice is the most helpful within the first 24 to 48 hours after an injury or flare-up from overusing a muscle or joint. Sprains, strains, spasms, burning pain, shooting pain, and aches can all be eased with ice. Ice can also be used when recovering from surgery. Ice is effective, has very few side effects, and is safe for most people to use. °PRECAUTIONS  °Ice is not a safe treatment option for people with: °· Raynaud phenomenon. This is a condition affecting small blood vessels in the extremities. Exposure to cold may cause your problems to return. °· Cold hypersensitivity. There are many forms of cold hypersensitivity, including: °¨ Cold urticaria. Red, itchy hives appear on the skin when the tissues begin to warm after being iced. °¨ Cold erythema. This is a red, itchy rash caused by exposure to cold. °¨ Cold hemoglobinuria. Red blood cells break down when the tissues begin to warm after being iced. The hemoglobin that carry oxygen are passed into the urine because they cannot combine with blood proteins fast enough. °· Numbness or altered sensitivity in the area being iced. °If you have any of the following conditions, do not use ice until you have discussed cryotherapy with your caregiver: °· Heart conditions, such as arrhythmia, angina, or chronic heart disease. °· High blood pressure. °· Healing wounds or open skin in the area being iced. °· Current infections. °· Rheumatoid arthritis. °· Poor circulation. °· Diabetes. °Ice slows the blood flow in the region it is applied. This is  beneficial when trying to stop inflamed tissues from spreading irritating chemicals to surrounding tissues. However, if you expose your skin to cold temperatures for too long or without the proper protection, you can damage your skin or nerves. Watch for signs of skin damage due to cold. °HOME CARE INSTRUCTIONS °Follow these tips to use ice and cold packs safely. °· Place a dry or damp towel between the ice and skin. A damp towel will cool the skin more quickly, so you may need to shorten the time that the ice is used. °· For a more rapid response, add gentle compression to the ice. °· Ice for no more than 10 to 20 minutes at a time. The bonier the area you are icing, the less time it will take to get the benefits of ice. °· Check your skin after 5 minutes to make sure there are no signs of a poor response to cold or skin damage. °· Rest 20 minutes or more between uses. °· Once your skin is numb, you can end your treatment. You can test numbness by very lightly touching your skin. The touch should be so light that you do not see the skin dimple from the pressure of your fingertip. When using ice, most people will feel these normal sensations in this order: cold, burning, aching, and numbness. °· Do not use ice on someone who cannot communicate their responses to pain, such as small children or people with dementia. °HOW TO MAKE AN ICE PACK °Ice packs are the most common way to use ice therapy. Other methods include ice   massage, ice baths, and cryosprays. Muscle creams that cause a cold, tingly feeling do not offer the same benefits that ice offers and should not be used as a substitute unless recommended by your caregiver. °To make an ice pack, do one of the following: °· Place crushed ice or a bag of frozen vegetables in a sealable plastic bag. Squeeze out the excess air. Place this bag inside another plastic bag. Slide the bag into a pillowcase or place a damp towel between your skin and the bag. °· Mix 3 parts  water with 1 part rubbing alcohol. Freeze the mixture in a sealable plastic bag. When you remove the mixture from the freezer, it will be slushy. Squeeze out the excess air. Place this bag inside another plastic bag. Slide the bag into a pillowcase or place a damp towel between your skin and the bag. °SEEK MEDICAL CARE IF: °· You develop white spots on your skin. This may give the skin a blotchy (mottled) appearance. °· Your skin turns blue or pale. °· Your skin becomes waxy or hard. °· Your swelling gets worse. °MAKE SURE YOU:  °· Understand these instructions. °· Will watch your condition. °· Will get help right away if you are not doing well or get worse. °  °This information is not intended to replace advice given to you by your health care provider. Make sure you discuss any questions you have with your health care provider. °  °Document Released: 11/24/2010 Document Revised: 04/20/2014 Document Reviewed: 11/24/2010 °Elsevier Interactive Patient Education ©2016 [employer] ° °

## 2015-05-20 NOTE — ED Notes (Signed)
Patient transported to X-ray 

## 2015-05-20 NOTE — ED Provider Notes (Signed)
CSN: [id number]     Arrival date & time 05/20/15  0820 History   First MD Initiated Contact with Patient 05/20/15 [phone]     Chief Complaint  Patient presents with  . Leg Pain     (Consider location/radiation/quality/duration/timing/severity/associated sxs/prior Treatment) HPI Patient presents with concern of leg pain. Pain has developed over the past 3 days. Pain is focally in the right proximal anterior and lateral thigh. Pain is worse with any attempt at motion, including ambulation. It is unclear if the patient has taken any medication for pain relief. No distal dysesthesia or weakness, no appreciable skin color changes. No other new complaints, including no chest pain, dyspnea. Patient is generally well.  Denies substantial medical problems, including DVT. Notably, the patient has spent the last 4 nights sleeping in a chair in the hospital, as his wife has been hospitalized.  Past Medical History  Diagnosis Date  . Hyperlipidemia   . Cataract   . Glaucoma    Past Surgical History  Procedure Laterality Date  . Colonoscopy    . Hernia repair     Family History  Problem Relation Age of Onset  . Colon cancer Brother   . Esophageal cancer Neg Hx   . Rectal cancer Neg Hx   . Stomach cancer Neg Hx   . Heart disease Father    Social History  Substance Use Topics  . Smoking status: Never Smoker   . Smokeless tobacco: Never Used  . Alcohol Use: No    Review of Systems  Constitutional:       Per HPI, otherwise negative  HENT:       Per HPI, otherwise negative  Respiratory:       Per HPI, otherwise negative  Cardiovascular:       Per HPI, otherwise negative  Gastrointestinal: Negative for vomiting.  Endocrine:       Negative aside from HPI  Genitourinary:       Neg aside from HPI   Musculoskeletal:       Per HPI, otherwise negative  Skin: Negative.   Neurological: Negative for syncope.      Allergies  Review of patient's allergies indicates no known  allergies.  Home Medications   Prior to Admission medications   Medication Sig Start Date End Date Taking? Authorizing Provider  aspirin 81 MG tablet Take 81 mg by mouth daily.     Yes Historical Provider, MD  dorzolamide-timolol (COSOPT) 22.3-6.8 MG/ML ophthalmic solution Place 1 drop into the left eye 2 (two) times daily.    Yes Historical Provider, MD  Multiple Vitamin (MULTIVITAMIN) tablet Take 1 tablet by mouth daily.     Yes Historical Provider, MD  simvastatin (ZOCOR) 10 MG tablet Take 1 tablet (10 mg total) by mouth at bedtime. 03/22/14  Yes Dorena Cookey, MD  sildenafil (VIAGRA) 50 MG tablet Take 1 tablet (50 mg total) by mouth daily as needed for erectile dysfunction. Patient not taking: Reported on 05/20/2015 03/23/14   Dorena Cookey, MD   BP 119/75 mmHg  Pulse 77  Temp(Src) 97.8 F (36.6 C) (Oral)  Resp 16  Wt 186 lb (84.369 kg)  SpO2 98% Physical Exam  Constitutional: He is oriented to person, place, and time. He appears well-developed. No distress.  HENT:  Head: Normocephalic and atraumatic.  Eyes: Conjunctivae and EOM are normal.  Cardiovascular: Normal rate and regular rhythm.   Pulmonary/Chest: Effort normal. No stridor. No respiratory distress.  Abdominal: He exhibits no distension.  Musculoskeletal: He  exhibits no edema.       Right hip: He exhibits tenderness and bony tenderness. He exhibits normal strength.       Right knee: Normal.       Right ankle: Normal.       Legs: No appreciable size difference between the legs, though there is tenderness, both in the superior hip, and proximal thigh.   Neurological: He is alert and oriented to person, place, and time.  Skin: Skin is warm and dry.  Psychiatric: He has a normal mood and affect.  Nursing note and vitals reviewed.   ED Course  Procedures (including critical care time) Labs Review Labs Reviewed  D-DIMER, QUANTITATIVE (NOT AT [facility])    Imaging Review Dg Hip Unilat With Pelvis 2-3 Views  Right  05/20/2015  CLINICAL DATA:  Pain without trauma. EXAM: DG HIP (WITH OR WITHOUT PELVIS) 2-3V RIGHT COMPARISON:  None. FINDINGS: No fracture, dislocation, or bony lesion identified. IMPRESSION: Negative. Electronically Signed   By: Dorise Bullion III M.D   On: 05/20/2015 09:35   I have personally reviewed and evaluated these images and lab results as part of my medical decision-making.  Patient's daughter assists with the history of present illness. On repeat exam the patient is in no distress.  MDM  Patient presents with new right leg pain. Notably, the patient has been sleeping in an unusual arrangement for the past few nights. Here, the patient has no distal neurovascular deficit, normal d-dimer reassuring for the low suspicion of DVT. With point tenderness about the hip, there suspicion for musculoskeletal etiology, though absent fever, chills, superficial changes, low suspicion for septic arthralgia. Patient started on cryotherapy, arthritis relief medication, will follow up with primary care, orthopedics.   Carmin Muskrat, MD 05/20/15 1023

## 2015-05-20 NOTE — ED Notes (Signed)
Pt reports right leg pain that started on Saturday and has continued to get worse. Also reports a tingling sensation down his leg.

## 2015-07-23 ENCOUNTER — Encounter: Payer: Self-pay | Admitting: Family Medicine

## 2015-07-23 ENCOUNTER — Ambulatory Visit (INDEPENDENT_AMBULATORY_CARE_PROVIDER_SITE_OTHER): Payer: Medicare Other | Admitting: Family Medicine

## 2015-07-23 VITALS — BP 140/80 | Temp 98.3°F | Ht 69.0 in | Wt 185.0 lb

## 2015-07-23 DIAGNOSIS — E785 Hyperlipidemia, unspecified: Secondary | ICD-10-CM

## 2015-07-23 DIAGNOSIS — H409 Unspecified glaucoma: Secondary | ICD-10-CM

## 2015-07-23 DIAGNOSIS — Z0000 Encounter for general adult medical examination without abnormal findings: Secondary | ICD-10-CM

## 2015-07-23 DIAGNOSIS — N401 Enlarged prostate with lower urinary tract symptoms: Secondary | ICD-10-CM | POA: Diagnosis not present

## 2015-07-23 DIAGNOSIS — R413 Other amnesia: Secondary | ICD-10-CM

## 2015-07-23 DIAGNOSIS — Z23 Encounter for immunization: Secondary | ICD-10-CM

## 2015-07-23 DIAGNOSIS — F528 Other sexual dysfunction not due to a substance or known physiological condition: Secondary | ICD-10-CM

## 2015-07-23 DIAGNOSIS — R351 Nocturia: Secondary | ICD-10-CM | POA: Diagnosis not present

## 2015-07-23 LAB — CBC WITH DIFFERENTIAL/PLATELET
BASOS ABS: 0 10*3/uL (ref 0.0–0.1)
Basophils Relative: 0.5 % (ref 0.0–3.0)
EOS ABS: 0.3 10*3/uL (ref 0.0–0.7)
Eosinophils Relative: 4.1 % (ref 0.0–5.0)
HCT: 39.3 % (ref 39.0–52.0)
Hemoglobin: 13.2 g/dL (ref 13.0–17.0)
LYMPHS ABS: 1.5 10*3/uL (ref 0.7–4.0)
Lymphocytes Relative: 21.8 % (ref 12.0–46.0)
MCHC: 33.5 g/dL (ref 30.0–36.0)
MCV: 85.2 fl (ref 78.0–100.0)
MONO ABS: 0.8 10*3/uL (ref 0.1–1.0)
Monocytes Relative: 12.4 % — ABNORMAL HIGH (ref 3.0–12.0)
NEUTROS ABS: 4.1 10*3/uL (ref 1.4–7.7)
NEUTROS PCT: 61.2 % (ref 43.0–77.0)
PLATELETS: 292 10*3/uL (ref 150.0–400.0)
RBC: 4.61 Mil/uL (ref 4.22–5.81)
RDW: 15.5 % (ref 11.5–15.5)
WBC: 6.8 10*3/uL (ref 4.0–10.5)

## 2015-07-23 LAB — TSH: TSH: 0.96 u[IU]/mL (ref 0.35–4.50)

## 2015-07-23 LAB — HEPATIC FUNCTION PANEL
ALBUMIN: 4.3 g/dL (ref 3.5–5.2)
ALK PHOS: 50 U/L (ref 39–117)
ALT: 19 U/L (ref 0–53)
AST: 25 U/L (ref 0–37)
Bilirubin, Direct: 0.1 mg/dL (ref 0.0–0.3)
TOTAL PROTEIN: 7.1 g/dL (ref 6.0–8.3)
Total Bilirubin: 1 mg/dL (ref 0.2–1.2)

## 2015-07-23 LAB — LIPID PANEL
CHOL/HDL RATIO: 5
Cholesterol: 235 mg/dL — ABNORMAL HIGH (ref 0–200)
HDL: 51.1 mg/dL (ref >39.00)
LDL CALC: 158 mg/dL — AB (ref 0–99)
NonHDL: 183.49
Triglycerides: 129 mg/dL (ref 0.0–149.0)
VLDL: 25.8 mg/dL (ref 0.0–40.0)

## 2015-07-23 LAB — BASIC METABOLIC PANEL
BUN: 10 mg/dL (ref 6–23)
CO2: 30 mEq/L (ref 19–32)
Calcium: 9.6 mg/dL (ref 8.4–10.5)
Chloride: 105 mEq/L (ref 96–112)
Creatinine, Ser: 0.96 mg/dL (ref 0.40–1.50)
GFR: 98.56 mL/min (ref >60.00)
Glucose, Bld: 106 mg/dL — ABNORMAL HIGH (ref 70–99)
POTASSIUM: 3.8 meq/L (ref 3.5–5.1)
SODIUM: 143 meq/L (ref 135–145)

## 2015-07-23 LAB — PSA: PSA: 2.68 ng/mL (ref 0.10–4.00)

## 2015-07-23 MED ORDER — SIMVASTATIN 10 MG PO TABS: 10.0000 mg | ORAL_TABLET | Freq: Every day | ORAL | Status: DC

## 2015-07-23 NOTE — Patient Instructions (Signed)
Zocor and aspirin.....Marland Kitchen one of each at bedtime  Return in one year for general physical exam sooner if any problems....... Tommi Rumps or Almyra Free are 2 new adult nurse practitioner to Dr. Martinique  Dr. Gloriann Loan......... DDS........Marland Kitchen encore [facility]

## 2015-07-23 NOTE — Progress Notes (Signed)
Pre visit review using our clinic review tool, if applicable. No additional management support is needed unless otherwise documented below in the visit note. 

## 2015-07-23 NOTE — Progress Notes (Signed)
   Subjective:    Patient ID: Tony Leach, male    DOB: 1940/06/04, 75 y.o.   MRN: [id number]  HPI Tony Leach is a 75 year old married male nonsmoker......... his wife has terminal cancer and he is caring for her at home.......... who comes in today for general physical examination  He takes an aspirin tablet daily it is not been taking his Zocor. He says he doesn't need the Viagra his wife has terminal cancer  He gets routine eye care.......... uses eyedrops from his ophthalmologist because of glaucoma..... No dental care..... Colonoscopy was done in 2016.    Cognitive function normal he does not exercise on a daily basis, home health safety reviewed no issues identified, no guns in the house, he does not have a healthcare power of attorney nor living well. Initially declined a Pneumovax but now agrees to take it. He also never gets a flu shot but he agrees to try it.   Review of Systems  Constitutional: Negative.   HENT: Negative.   Eyes: Negative.   Respiratory: Negative.   Cardiovascular: Negative.   Gastrointestinal: Negative.   Endocrine: Negative.   Genitourinary: Negative.   Musculoskeletal: Negative.   Skin: Negative.   Allergic/Immunologic: Negative.   Neurological: Negative.   Hematological: Negative.   Psychiatric/Behavioral: Negative.        Objective:   Physical Exam  Constitutional: He is oriented to person, place, and time. He appears well-developed and well-nourished.  HENT:  Head: Normocephalic and atraumatic.  Right Ear: External ear normal.  Left Ear: External ear normal.  Nose: Nose normal.  Mouth/Throat: Oropharynx is clear and moist.  Eyes: Conjunctivae and EOM are normal. Pupils are equal, round, and reactive to light.  Neck: Normal range of motion. Neck supple. No JVD present. No tracheal deviation present. No thyromegaly present.  Cardiovascular: Normal rate, regular rhythm, normal heart sounds and intact distal pulses.  Exam reveals no gallop  and no friction rub.   No murmur heard. Pulmonary/Chest: Effort normal and breath sounds normal. No stridor. No respiratory distress. He has no wheezes. He has no rales. He exhibits no tenderness.  Abdominal: Soft. Bowel sounds are normal. He exhibits no distension and no mass. There is no tenderness. There is no rebound and no guarding.  Genitourinary: Rectum normal and penis normal. Guaiac negative stool. No penile tenderness.  1+ symmetrical nonnodular BPH  Musculoskeletal: Normal range of motion. He exhibits no edema or tenderness.  Lymphadenopathy:    He has no cervical adenopathy.  Neurological: He is alert and oriented to person, place, and time. He has normal reflexes. No cranial nerve deficit. He exhibits normal muscle tone.  Skin: Skin is warm and dry. No rash noted. No erythema. No pallor.  Psychiatric: He has a normal mood and affect. His behavior is normal. Judgment and thought content normal.  Nursing note and vitals reviewed.         Assessment & Plan:Hyperlipidemia.Hyperlipidemia  Hyperlipidemia........... continue aspirin restart the Zocor  Glaucoma........ continue eyedrops follow-up ophthalmologist  Poor dentition...Marland KitchenMarland KitchenMarland Kitchen dental consult with Dr. Gloriann Loan

## 2015-10-09 DIAGNOSIS — H40053 Ocular hypertension, bilateral: Secondary | ICD-10-CM | POA: Diagnosis not present

## 2015-10-09 DIAGNOSIS — H25813 Combined forms of age-related cataract, bilateral: Secondary | ICD-10-CM | POA: Diagnosis not present

## 2016-05-05 DIAGNOSIS — H40053 Ocular hypertension, bilateral: Secondary | ICD-10-CM | POA: Diagnosis not present

## 2016-05-05 DIAGNOSIS — H25813 Combined forms of age-related cataract, bilateral: Secondary | ICD-10-CM | POA: Diagnosis not present

## 2016-06-20 IMAGING — DX DG HIP (WITH OR WITHOUT PELVIS) 2-3V*R*
3 series · 3 of 3 positions shown · non-contrast
Comparison: None.

CLINICAL DATA: Pain without trauma.

EXAM:
DG HIP (WITH OR WITHOUT PELVIS) 2-3V RIGHT

[pelvis ap]
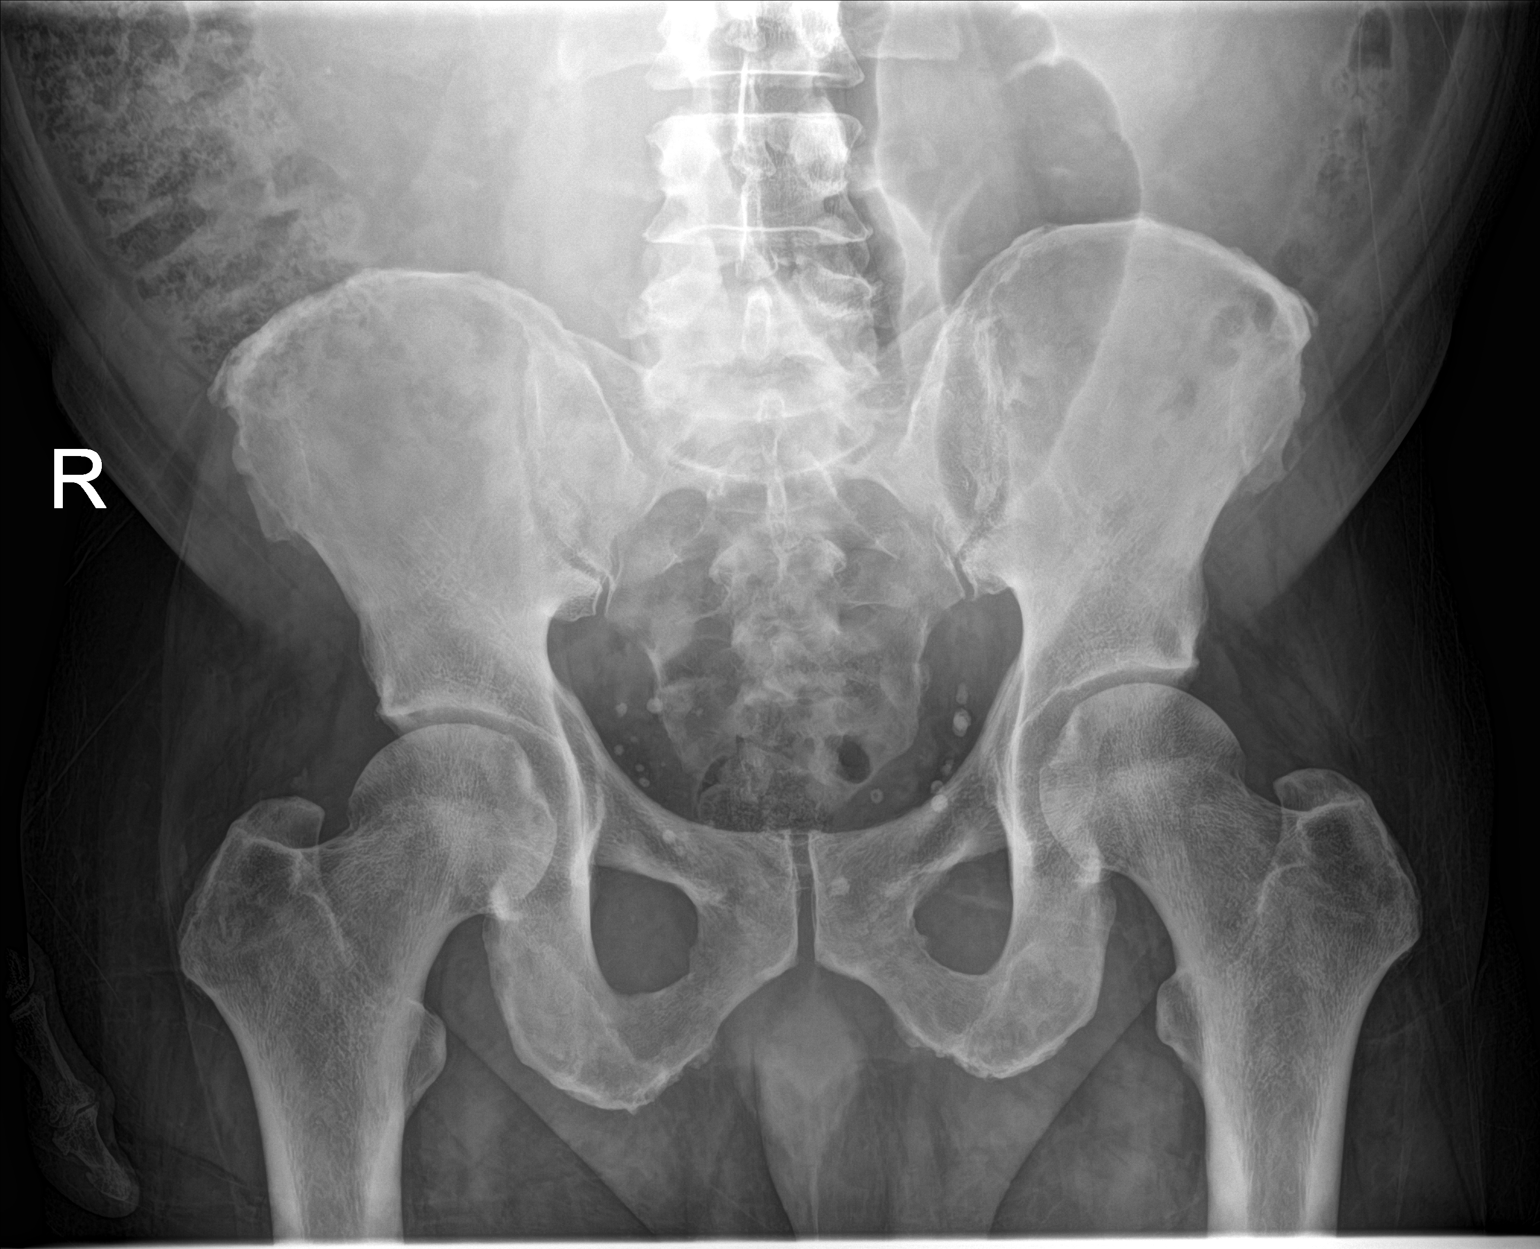

[hip ap]
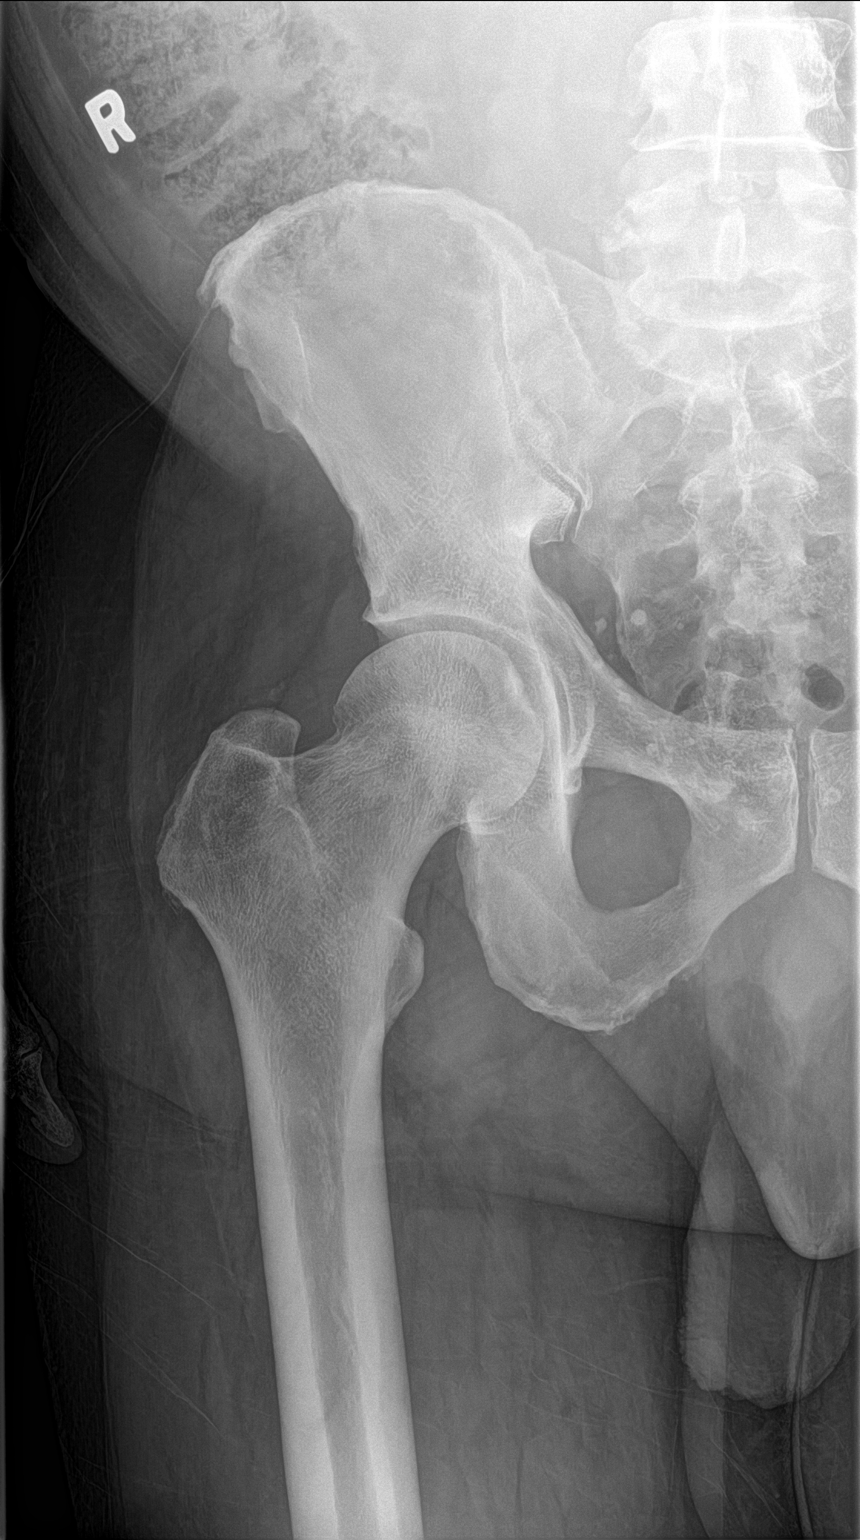

[hip lat]
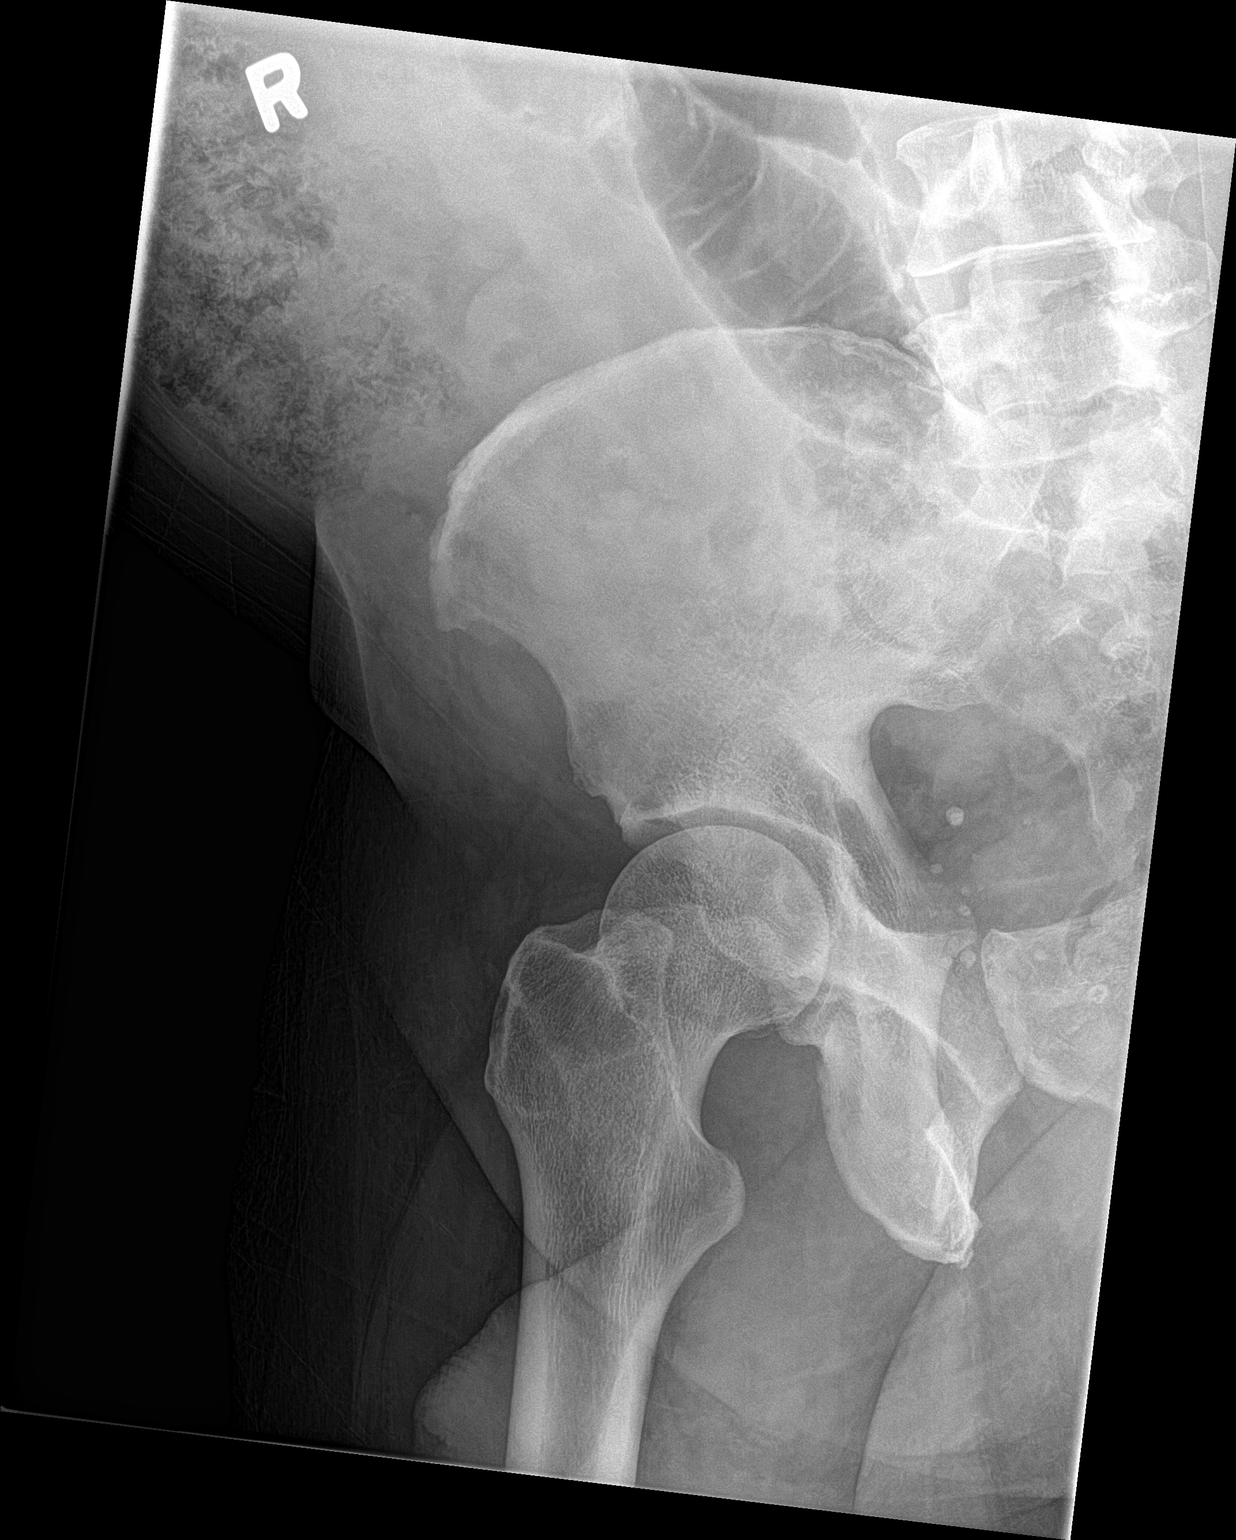

[3 of 3 positions shown; findings below may reference images not displayed]

FINDINGS: No fracture, dislocation, or bony lesion identified.
IMPRESSION: Negative.

## 2016-11-03 DIAGNOSIS — H40053 Ocular hypertension, bilateral: Secondary | ICD-10-CM | POA: Diagnosis not present

## 2016-11-03 DIAGNOSIS — H25813 Combined forms of age-related cataract, bilateral: Secondary | ICD-10-CM | POA: Diagnosis not present

## 2017-05-06 DIAGNOSIS — H40053 Ocular hypertension, bilateral: Secondary | ICD-10-CM | POA: Diagnosis not present

## 2017-11-02 DIAGNOSIS — H25813 Combined forms of age-related cataract, bilateral: Secondary | ICD-10-CM | POA: Diagnosis not present

## 2017-11-02 DIAGNOSIS — H40053 Ocular hypertension, bilateral: Secondary | ICD-10-CM | POA: Diagnosis not present

## 2017-11-11 ENCOUNTER — Other Ambulatory Visit: Payer: Self-pay

## 2017-12-15 ENCOUNTER — Ambulatory Visit: Payer: Medicare Other | Admitting: Family Medicine

## 2017-12-15 DIAGNOSIS — Z0289 Encounter for other administrative examinations: Secondary | ICD-10-CM

## 2018-10-17 ENCOUNTER — Other Ambulatory Visit: Payer: Self-pay

## 2018-10-17 ENCOUNTER — Encounter: Payer: Self-pay | Admitting: Family Medicine

## 2018-10-17 ENCOUNTER — Ambulatory Visit (INDEPENDENT_AMBULATORY_CARE_PROVIDER_SITE_OTHER): Payer: Medicare Other | Admitting: Family Medicine

## 2018-10-17 VITALS — BP 118/72 | HR 68 | Temp 98.2°F | Ht 68.0 in | Wt 165.6 lb

## 2018-10-17 DIAGNOSIS — F528 Other sexual dysfunction not due to a substance or known physiological condition: Secondary | ICD-10-CM | POA: Diagnosis not present

## 2018-10-17 DIAGNOSIS — H409 Unspecified glaucoma: Secondary | ICD-10-CM | POA: Diagnosis not present

## 2018-10-17 DIAGNOSIS — E785 Hyperlipidemia, unspecified: Secondary | ICD-10-CM

## 2018-10-17 DIAGNOSIS — Z23 Encounter for immunization: Secondary | ICD-10-CM | POA: Diagnosis not present

## 2018-10-17 LAB — BASIC METABOLIC PANEL
BUN: 8 mg/dL (ref 6–23)
CO2: 28 mEq/L (ref 19–32)
Calcium: 9.5 mg/dL (ref 8.4–10.5)
Chloride: 105 mEq/L (ref 96–112)
Creatinine, Ser: 1.01 mg/dL (ref 0.40–1.50)
GFR: 86.69 mL/min (ref >60.00)
Glucose, Bld: 99 mg/dL (ref 70–99)
Potassium: 4.4 mEq/L (ref 3.5–5.1)
Sodium: 141 mEq/L (ref 135–145)

## 2018-10-17 LAB — HEPATIC FUNCTION PANEL
ALT: 10 U/L (ref 0–53)
AST: 12 U/L (ref 0–37)
Albumin: 4.5 g/dL (ref 3.5–5.2)
Alkaline Phosphatase: 47 U/L (ref 39–117)
Bilirubin, Direct: 0.2 mg/dL (ref 0.0–0.3)
Total Bilirubin: 1.3 mg/dL — ABNORMAL HIGH (ref 0.2–1.2)
Total Protein: 7.1 g/dL (ref 6.0–8.3)

## 2018-10-17 LAB — LIPID PANEL
Cholesterol: 238 mg/dL — ABNORMAL HIGH (ref 0–200)
HDL: 45.7 mg/dL (ref >39.00)
LDL Cholesterol: 159 mg/dL — ABNORMAL HIGH (ref 0–99)
NonHDL: 192.59
Total CHOL/HDL Ratio: 5
Triglycerides: 167 mg/dL — ABNORMAL HIGH (ref 0.0–149.0)
VLDL: 33.4 mg/dL (ref 0.0–40.0)

## 2018-10-17 NOTE — Progress Notes (Signed)
  Subjective:     Patient ID: Tony Leach, male   DOB: Jul 20, 1940, 78 y.o.   MRN: [id number]  HPI Patient is seen basically to reestablish care.  This was listed as a transfer of care but he has not been seen here since 07/23/2015 so this is effectively a new visit.  Patient has history of hyperlipidemia.  He has been on low-dose simvastatin but apparently not taking for the past several months and possibly years.  He states he is fasting today.  He sees ophthalmologist regularly for glaucoma.  Takes no other regular medications.  He walks for exercise and has had no recent falls.  Denies any depression symptoms.  No documentation of prior Pneumovax.  He has had Prevnar.  He thinks his father had coronary disease.  Patient has been retired for several years.  His wife battled cancer for years and has apparently survived.  He was vague with regard to what type of cancer  Past Medical History:  Diagnosis Date  . Cataract   . Glaucoma   . Hyperlipidemia    Past Surgical History:  Procedure Laterality Date  . COLONOSCOPY    . HERNIA REPAIR      reports that he has never smoked. He has never used smokeless tobacco. He reports that he does not drink alcohol or use drugs. family history includes Colon cancer in his brother; Heart disease in his father. No Known Allergies   Review of Systems  Constitutional: Negative for appetite change, fatigue and unexpected weight change.  Eyes: Negative for visual disturbance.  Respiratory: Negative for cough, chest tightness and shortness of breath.   Cardiovascular: Negative for chest pain, palpitations and leg swelling.  Gastrointestinal: Negative for abdominal pain, constipation and diarrhea.  Endocrine: Negative for polydipsia and polyuria.  Neurological: Negative for dizziness, syncope, weakness, light-headedness and headaches.  Hematological: Negative for adenopathy.  Psychiatric/Behavioral: Negative for agitation and dysphoric mood.        Objective:   Physical Exam Constitutional:      Appearance: He is well-developed.  HENT:     Right Ear: External ear normal.     Left Ear: External ear normal.  Eyes:     Pupils: Pupils are equal, round, and reactive to light.  Neck:     Musculoskeletal: Neck supple.     Thyroid: No thyromegaly.  Cardiovascular:     Rate and Rhythm: Normal rate and regular rhythm.  Pulmonary:     Effort: Pulmonary effort is normal. No respiratory distress.     Breath sounds: Normal breath sounds. No wheezing or rales.  Musculoskeletal:     Right lower leg: No edema.     Left lower leg: No edema.  Neurological:     Mental Status: He is alert and oriented to person, place, and time.        Assessment:     #1 hyperlipidemia history.  Patient has been on simvastatin in the past but currently not taking  #2 glaucoma followed closely by ophthalmology  #3 history of erectile dysfunction    Plan:     -Recheck labs with basic metabolic panel, lipid panel, hepatic panel -We will consider whether to reinitiate simvastatin based on lab results above -Pneumovax given -Discussed yearly flu vaccine although he apparently is not gotten flu vaccines in the past -We will plan routine follow-up in 1 year and sooner as needed  Tony Post MD [facility]

## 2018-10-25 ENCOUNTER — Other Ambulatory Visit: Payer: Self-pay

## 2018-10-25 DIAGNOSIS — E785 Hyperlipidemia, unspecified: Secondary | ICD-10-CM

## 2018-10-25 MED ORDER — SIMVASTATIN 10 MG PO TABS: 10.0000 mg | ORAL_TABLET | Freq: Every day | ORAL | 3 refills | Status: DC

## 2018-12-20 ENCOUNTER — Other Ambulatory Visit: Payer: Medicare Other

## 2018-12-20 ENCOUNTER — Other Ambulatory Visit: Payer: Self-pay | Admitting: Family Medicine

## 2018-12-20 DIAGNOSIS — E785 Hyperlipidemia, unspecified: Secondary | ICD-10-CM

## 2019-06-15 ENCOUNTER — Encounter: Payer: Self-pay | Admitting: Gastroenterology

## 2019-10-17 ENCOUNTER — Ambulatory Visit (INDEPENDENT_AMBULATORY_CARE_PROVIDER_SITE_OTHER): Payer: Medicare Other | Admitting: Family Medicine

## 2019-10-17 ENCOUNTER — Other Ambulatory Visit: Payer: Self-pay

## 2019-10-17 ENCOUNTER — Encounter: Payer: Self-pay | Admitting: Family Medicine

## 2019-10-17 VITALS — BP 110/70 | HR 60 | Temp 97.6°F | Ht 68.0 in | Wt 161.7 lb

## 2019-10-17 DIAGNOSIS — E785 Hyperlipidemia, unspecified: Secondary | ICD-10-CM | POA: Diagnosis not present

## 2019-10-17 DIAGNOSIS — R413 Other amnesia: Secondary | ICD-10-CM | POA: Diagnosis not present

## 2019-10-17 MED ORDER — SIMVASTATIN 10 MG PO TABS: 10.0000 mg | ORAL_TABLET | Freq: Every day | ORAL | 3 refills | Status: DC

## 2019-10-17 NOTE — Patient Instructions (Signed)
Try to bring your wife to 3 month follow up.

## 2019-10-17 NOTE — Progress Notes (Signed)
Established Patient Office Visit  Subjective:  Patient ID: Tony Leach, male    DOB: 1940-08-07  Age: 79 y.o. MRN: [id number]  CC:  Chief Complaint  Patient presents with  . Follow-up    x 1 year    HPI Tony Leach presents for medical follow-up.  He lives at home with his wife.  He has medical problems include history of hyperlipidemia, history of BPH, glaucoma.  He does have some short-term memory loss and seems somewhat uncertain about medications.  He states he has not been taking his simvastatin regularly.  He is due for follow-up labs.  He still drives some.  Denies any recent chest pains, dizziness, falls.  He states he still walks sometimes 2 miles per day for exercise  Denies any urinary flow issues at this time.    Past Medical History:  Diagnosis Date  . Cataract   . Glaucoma   . Hyperlipidemia     Past Surgical History:  Procedure Laterality Date  . COLONOSCOPY    . HERNIA REPAIR      Family History  Problem Relation Age of Onset  . Heart disease Father   . Colon cancer Brother   . Esophageal cancer Neg Hx   . Rectal cancer Neg Hx   . Stomach cancer Neg Hx     Social History   Socioeconomic History  . Marital status: Married    Spouse name: Not on file  . Number of children: Not on file  . Years of education: Not on file  . Highest education level: Not on file  Occupational History  . Not on file  Tobacco Use  . Smoking status: Never Smoker  . Smokeless tobacco: Never Used  Vaping Use  . Vaping Use: Never used  Substance and Sexual Activity  . Alcohol use: No  . Drug use: No  . Sexual activity: Not on file  Other Topics Concern  . Not on file  Social History Narrative  . Not on file   Social Determinants of Health   Financial Resource Strain:   . Difficulty of Paying Living Expenses:   Food Insecurity:   . Worried About Charity fundraiser in the Last Year:   . Arboriculturist in the Last Year:   Transportation Needs:   .  Film/video editor (Medical):   Marland Kitchen Lack of Transportation (Non-Medical):   Physical Activity:   . Days of Exercise per Week:   . Minutes of Exercise per Session:   Stress:   . Feeling of Stress :   Social Connections:   . Frequency of Communication with Friends and Family:   . Frequency of Social Gatherings with Friends and Family:   . Attends Religious Services:   . Active Member of Clubs or Organizations:   . Attends Archivist Meetings:   Marland Kitchen Marital Status:   Intimate Partner Violence:   . Fear of Current or Ex-Partner:   . Emotionally Abused:   Marland Kitchen Physically Abused:   . Sexually Abused:     Outpatient Medications Prior to Visit  Medication Sig Dispense Refill  . acetaminophen (TYLENOL) 500 MG tablet Take 1 tablet (500 mg total) by mouth every 6 (six) hours as needed. 30 tablet 0  . aspirin 81 MG tablet Take 81 mg by mouth daily.      . dorzolamide-timolol (COSOPT) 22.3-6.8 MG/ML ophthalmic solution Place 1 drop into the left eye 2 (two) times daily.     Marland Kitchen  Multiple Vitamin (MULTIVITAMIN) tablet Take 1 tablet by mouth daily.      . sildenafil (VIAGRA) 50 MG tablet Take 1 tablet (50 mg total) by mouth daily as needed for erectile dysfunction. 6 tablet 5  . simvastatin (ZOCOR) 10 MG tablet Take 1 tablet (10 mg total) by mouth at bedtime. 90 tablet 3   No facility-administered medications prior to visit.    No Known Allergies  ROS Review of Systems  Constitutional: Negative for fatigue.  Eyes: Negative for visual disturbance.  Respiratory: Negative for cough, chest tightness and shortness of breath.   Cardiovascular: Negative for chest pain, palpitations and leg swelling.  Endocrine: Negative for polydipsia and polyuria.  Neurological: Negative for dizziness, syncope, weakness, light-headedness and headaches.      Objective:    Physical Exam Constitutional:      Appearance: He is well-developed.  HENT:     Right Ear: External ear normal.     Left Ear:  External ear normal.  Eyes:     Pupils: Pupils are equal, round, and reactive to light.  Neck:     Thyroid: No thyromegaly.  Cardiovascular:     Rate and Rhythm: Normal rate and regular rhythm.  Pulmonary:     Effort: Pulmonary effort is normal. No respiratory distress.     Breath sounds: Normal breath sounds. No wheezing or rales.  Musculoskeletal:     Cervical back: Neck supple.     Right lower leg: No edema.     Left lower leg: No edema.  Neurological:     Mental Status: He is alert.     BP 110/70 (BP Location: Left Arm, Patient Position: Sitting, Cuff Size: Normal)   Pulse 60   Temp 97.6 F (36.4 C) (Temporal)   Ht 5\' 8"  (1.727 m)   Wt 161 lb 11.2 oz (73.3 kg)   SpO2 97%   BMI 24.59 kg/m  Wt Readings from Last 3 Encounters:  10/17/19 161 lb 11.2 oz (73.3 kg)  10/17/18 165 lb 9.6 oz (75.1 kg)  07/23/15 185 lb (83.9 kg)     Health Maintenance Due  Topic Date Due  . Hepatitis C Screening  Never done  . COVID-19 Vaccine (1) Never done  . TETANUS/TDAP  12/06/2016    There are no preventive care reminders to display for this patient.  Lab Results  Component Value Date   TSH 0.96 07/23/2015   Lab Results  Component Value Date   WBC 6.8 07/23/2015   HGB 13.2 07/23/2015   HCT 39.3 07/23/2015   MCV 85.2 07/23/2015   PLT 292.0 07/23/2015   Lab Results  Component Value Date   NA 141 10/17/2018   K 4.4 10/17/2018   CO2 28 10/17/2018   GLUCOSE 99 10/17/2018   BUN 8 10/17/2018   CREATININE 1.01 10/17/2018   BILITOT 1.3 (H) 10/17/2018   ALKPHOS 47 10/17/2018   AST 12 10/17/2018   ALT 10 10/17/2018   PROT 7.1 10/17/2018   ALBUMIN 4.5 10/17/2018   CALCIUM 9.5 10/17/2018   GFR 86.69 10/17/2018   Lab Results  Component Value Date   CHOL 238 (H) 10/17/2018   Lab Results  Component Value Date   HDL 45.70 10/17/2018   Lab Results  Component Value Date   LDLCALC 159 (H) 10/17/2018   Lab Results  Component Value Date   TRIG 167.0 (H) 10/17/2018    Lab Results  Component Value Date   CHOLHDL 5 10/17/2018   No results found for: HGBA1C  Assessment & Plan:   Problem List Items Addressed This Visit      Unprioritized   Hyperlipidemia   Relevant Medications   simvastatin (ZOCOR) 10 MG tablet   Other Relevant Orders   Basic metabolic panel   Lipid panel   Hepatic function panel      Meds ordered this encounter  Medications  . simvastatin (ZOCOR) 10 MG tablet    Sig: Take 1 tablet (10 mg total) by mouth at bedtime.    Dispense:  90 tablet    Refill:  3  We have asked that his wife, at follow-up visit.  We like to see him in 3 months and get her perspective on his cognitive function and do some further testing at that time.  He has had previous thyroid and B12 screening which was normal  Follow-up: Return in about 3 months (around 01/17/2020).    Carolann Littler, MD

## 2020-01-17 ENCOUNTER — Encounter: Payer: Self-pay | Admitting: Family Medicine

## 2020-01-17 ENCOUNTER — Other Ambulatory Visit: Payer: Self-pay

## 2020-01-17 ENCOUNTER — Ambulatory Visit (INDEPENDENT_AMBULATORY_CARE_PROVIDER_SITE_OTHER): Payer: Medicare Other | Admitting: Family Medicine

## 2020-01-17 VITALS — BP 134/70 | HR 60 | Temp 98.1°F | Ht 68.0 in | Wt 162.7 lb

## 2020-01-17 DIAGNOSIS — R413 Other amnesia: Secondary | ICD-10-CM | POA: Diagnosis not present

## 2020-01-17 DIAGNOSIS — E785 Hyperlipidemia, unspecified: Secondary | ICD-10-CM | POA: Diagnosis not present

## 2020-01-17 NOTE — Progress Notes (Signed)
Established Patient Office Visit  Subjective:  Patient ID: Tony Leach, male    DOB: February 11, 1941  Age: 79 y.o. MRN: [id number]  CC:  Chief Complaint  Patient presents with  . Follow-up    HPI Tony Leach presents for medical follow-up. His see me just once previously back last July. We had noted then during the interview that he seemed very confused regarding medications. He has history of glaucoma, hyperlipidemia, erectile dysfunction. We had requested that his wife come with him to this visit. She is not here today. Patient apparently still drives some but very limited distance and locally.  He takes simvastatin 10 mg once daily for lipids and is overdue for follow-up labs. He has not had flu vaccine and declines. When we inquired about Covid vaccination he seemed very confused about what that was. He did not seem to understand even what we were describing in terms of the Covid pandemic. He states his long-term memory is excellent. He obviously has some major deficits with short-term memory.  He still walks daily apparently couple miles. No recent falls. No balance issues.  Past Medical History:  Diagnosis Date  . Cataract   . Glaucoma   . Hyperlipidemia     Past Surgical History:  Procedure Laterality Date  . COLONOSCOPY    . HERNIA REPAIR      Family History  Problem Relation Age of Onset  . Heart disease Father   . Colon cancer Brother   . Esophageal cancer Neg Hx   . Rectal cancer Neg Hx   . Stomach cancer Neg Hx     Social History   Socioeconomic History  . Marital status: Married    Spouse name: Not on file  . Number of children: Not on file  . Years of education: Not on file  . Highest education level: Not on file  Occupational History  . Not on file  Tobacco Use  . Smoking status: Never Smoker  . Smokeless tobacco: Never Used  Vaping Use  . Vaping Use: Never used  Substance and Sexual Activity  . Alcohol use: No  . Drug use: No  . Sexual  activity: Not on file  Other Topics Concern  . Not on file  Social History Narrative  . Not on file   Social Determinants of Health   Financial Resource Strain:   . Difficulty of Paying Living Expenses: Not on file  Food Insecurity:   . Worried About Charity fundraiser in the Last Year: Not on file  . Ran Out of Food in the Last Year: Not on file  Transportation Needs:   . Lack of Transportation (Medical): Not on file  . Lack of Transportation (Non-Medical): Not on file  Physical Activity:   . Days of Exercise per Week: Not on file  . Minutes of Exercise per Session: Not on file  Stress:   . Feeling of Stress : Not on file  Social Connections:   . Frequency of Communication with Friends and Family: Not on file  . Frequency of Social Gatherings with Friends and Family: Not on file  . Attends Religious Services: Not on file  . Active Member of Clubs or Organizations: Not on file  . Attends Archivist Meetings: Not on file  . Marital Status: Not on file  Intimate Partner Violence:   . Fear of Current or Ex-Partner: Not on file  . Emotionally Abused: Not on file  . Physically Abused: Not on  file  . Sexually Abused: Not on file    Outpatient Medications Prior to Visit  Medication Sig Dispense Refill  . acetaminophen (TYLENOL) 500 MG tablet Take 1 tablet (500 mg total) by mouth every 6 (six) hours as needed. 30 tablet 0  . aspirin 81 MG tablet Take 81 mg by mouth daily.      . dorzolamide-timolol (COSOPT) 22.3-6.8 MG/ML ophthalmic solution Place 1 drop into the left eye 2 (two) times daily.     . Multiple Vitamin (MULTIVITAMIN) tablet Take 1 tablet by mouth daily.      . sildenafil (VIAGRA) 50 MG tablet Take 1 tablet (50 mg total) by mouth daily as needed for erectile dysfunction. 6 tablet 5  . simvastatin (ZOCOR) 10 MG tablet Take 1 tablet (10 mg total) by mouth at bedtime. 90 tablet 3   No facility-administered medications prior to visit.    No Known  Allergies  ROS Review of Systems  Constitutional: Negative for chills and fever.  Respiratory: Negative for cough and shortness of breath.   Cardiovascular: Negative for chest pain, palpitations and leg swelling.  Neurological: Negative for dizziness and headaches.      Objective:    Physical Exam Vitals reviewed.  Constitutional:      Appearance: Normal appearance.  Cardiovascular:     Rate and Rhythm: Normal rate and regular rhythm.  Pulmonary:     Effort: Pulmonary effort is normal.     Breath sounds: Normal breath sounds.  Musculoskeletal:     Right lower leg: No edema.     Left lower leg: No edema.  Neurological:     General: No focal deficit present.     Mental Status: He is alert.     Cranial Nerves: No cranial nerve deficit.  Psychiatric:     Comments: Patient not oriented to day of week, year, date, month 3 word recall 0/3     BP 134/70 (BP Location: Left Arm, Cuff Size: Normal)   Pulse 60   Temp 98.1 F (36.7 C) (Oral)   Ht 5\' 8"  (1.727 m)   Wt 162 lb 11.2 oz (73.8 kg)   SpO2 99%   BMI 24.74 kg/m  Wt Readings from Last 3 Encounters:  01/17/20 162 lb 11.2 oz (73.8 kg)  10/17/19 161 lb 11.2 oz (73.3 kg)  10/17/18 165 lb 9.6 oz (75.1 kg)     Health Maintenance Due  Topic Date Due  . Hepatitis C Screening  Never done  . COVID-19 Vaccine (1) Never done  . TETANUS/TDAP  12/06/2016  . INFLUENZA VACCINE  Never done    There are no preventive care reminders to display for this patient.  Lab Results  Component Value Date   TSH 0.96 07/23/2015   Lab Results  Component Value Date   WBC 6.8 07/23/2015   HGB 13.2 07/23/2015   HCT 39.3 07/23/2015   MCV 85.2 07/23/2015   PLT 292.0 07/23/2015   Lab Results  Component Value Date   NA 141 10/17/2018   K 4.4 10/17/2018   CO2 28 10/17/2018   GLUCOSE 99 10/17/2018   BUN 8 10/17/2018   CREATININE 1.01 10/17/2018   BILITOT 1.3 (H) 10/17/2018   ALKPHOS 47 10/17/2018   AST 12 10/17/2018   ALT 10  10/17/2018   PROT 7.1 10/17/2018   ALBUMIN 4.5 10/17/2018   CALCIUM 9.5 10/17/2018   GFR 86.69 10/17/2018   Lab Results  Component Value Date   CHOL 238 (H) 10/17/2018   Lab  Results  Component Value Date   HDL 45.70 10/17/2018   Lab Results  Component Value Date   LDLCALC 159 (H) 10/17/2018   Lab Results  Component Value Date   TRIG 167.0 (H) 10/17/2018   Lab Results  Component Value Date   CHOLHDL 5 10/17/2018   No results found for: HGBA1C    Assessment & Plan:    #1 hyperlipidemia. Patient on simvastatin. Overdue for labs.  -Check labs today with lipid panel, hepatic panel, basic metabolic panel  #2 cognitive impairment. Suspect he has fairly advanced dementia and screening today. His wife was not here accompanying him and we will call her to get more information. He seemed very confused regarding medications and vaccines. Will need to discuss with wife concerns regarding safety of driving and also increasing risk of him getting lost May be too far advanced to get much benefit from Aricept or Namenda but will discuss with wife.  -We tried to discuss flu vaccine but he declines. -Not clear if he has had Covid vaccine yet.  No orders of the defined types were placed in this encounter.   Follow-up: Return in about 6 months (around 07/17/2020).    Carolann Littler, MD

## 2020-01-18 LAB — HEPATIC FUNCTION PANEL
AG Ratio: 1.7 (calc) (ref 1.0–2.5)
ALT: 10 U/L (ref 9–46)
AST: 14 U/L (ref 10–35)
Albumin: 4.1 g/dL (ref 3.6–5.1)
Alkaline phosphatase (APISO): 40 U/L (ref 35–144)
Bilirubin, Direct: 0.2 mg/dL (ref 0.0–0.2)
Globulin: 2.4 g/dL (calc) (ref 1.9–3.7)
Indirect Bilirubin: 1.1 mg/dL (calc) (ref 0.2–1.2)
Total Bilirubin: 1.3 mg/dL — ABNORMAL HIGH (ref 0.2–1.2)
Total Protein: 6.5 g/dL (ref 6.1–8.1)

## 2020-01-18 LAB — BASIC METABOLIC PANEL
BUN: 13 mg/dL (ref 7–25)
CO2: 29 mmol/L (ref 20–32)
Calcium: 9.3 mg/dL (ref 8.6–10.3)
Chloride: 107 mmol/L (ref 98–110)
Creat: 0.97 mg/dL (ref 0.70–1.18)
Glucose, Bld: 99 mg/dL (ref 65–99)
Potassium: 4.4 mmol/L (ref 3.5–5.3)
Sodium: 141 mmol/L (ref 135–146)

## 2020-01-18 LAB — LIPID PANEL
Cholesterol: 202 mg/dL — ABNORMAL HIGH (ref <200)
HDL: 55 mg/dL (ref >=40)
LDL Cholesterol (Calc): 130 mg/dL (calc) — ABNORMAL HIGH
Non-HDL Cholesterol (Calc): 147 mg/dL (calc) — ABNORMAL HIGH (ref <130)
Total CHOL/HDL Ratio: 3.7 (calc) (ref <5.0)
Triglycerides: 76 mg/dL (ref <150)

## 2020-02-14 ENCOUNTER — Ambulatory Visit (INDEPENDENT_AMBULATORY_CARE_PROVIDER_SITE_OTHER): Payer: Medicare Other

## 2020-02-14 ENCOUNTER — Other Ambulatory Visit: Payer: Self-pay

## 2020-02-14 DIAGNOSIS — Z1211 Encounter for screening for malignant neoplasm of colon: Secondary | ICD-10-CM

## 2020-02-14 DIAGNOSIS — Z0000 Encounter for general adult medical examination without abnormal findings: Secondary | ICD-10-CM | POA: Diagnosis not present

## 2020-02-14 NOTE — Patient Instructions (Signed)
Tony Leach , Thank you for taking time to come for your Medicare Wellness Visit. I appreciate your ongoing commitment to your health goals. Please review the following plan we discussed and let me know if I can assist you in the future.   Screening recommendations/referrals: Colonoscopy: Currently due, order placed this visit for Gastroenterology Recommended yearly ophthalmology/optometry visit for glaucoma screening and checkup Recommended yearly dental visit for hygiene and checkup  Vaccinations: Influenza vaccine: Patient refused Pneumococcal vaccine: Completed series  Tdap vaccine: Currently due, if you wish to receive this vaccine you may contact your insurance company to discuss cost before receiving  Shingles vaccine: Currently due for Shingrix, if you wish to receive you may do so at your local pharmacy.    Advanced directives: Advance directive discussed with you today. Even though you declined this today please call our office should you change your mind and we can give you the proper paperwork for you to fill out.   Conditions/risks identified: None   Next appointment: None   Preventive Care 65 Years and Older, Male Preventive care refers to lifestyle choices and visits with your health care provider that can promote health and wellness. What does preventive care include?  A yearly physical exam. This is also called an annual well check.  Dental exams once or twice a year.  Routine eye exams. Ask your health care provider how often you should have your eyes checked.  Personal lifestyle choices, including:  Daily care of your teeth and gums.  Regular physical activity.  Eating a healthy diet.  Avoiding tobacco and drug use.  Limiting alcohol use.  Practicing safe sex.  Taking low doses of aspirin every day.  Taking vitamin and mineral supplements as recommended by your health care provider. What happens during an annual well check? The services and screenings  done by your health care provider during your annual well check will depend on your age, overall health, lifestyle risk factors, and family history of disease. Counseling  Your health care provider may ask you questions about your:  Alcohol use.  Tobacco use.  Drug use.  Emotional well-being.  Home and relationship well-being.  Sexual activity.  Eating habits.  History of falls.  Memory and ability to understand (cognition).  Work and work Statistician. Screening  You may have the following tests or measurements:  Height, weight, and BMI.  Blood pressure.  Lipid and cholesterol levels. These may be checked every 5 years, or more frequently if you are over 60 years old.  Skin check.  Lung cancer screening. You may have this screening every year starting at age 31 if you have a 30-pack-year history of smoking and currently smoke or have quit within the past 15 years.  Fecal occult blood test (FOBT) of the stool. You may have this test every year starting at age 60.  Flexible sigmoidoscopy or colonoscopy. You may have a sigmoidoscopy every 5 years or a colonoscopy every 10 years starting at age 20.  Prostate cancer screening. Recommendations will vary depending on your family history and other risks.  Hepatitis C blood test.  Hepatitis B blood test.  Sexually transmitted disease (STD) testing.  Diabetes screening. This is done by checking your blood sugar (glucose) after you have not eaten for a while (fasting). You may have this done every 1-3 years.  Abdominal aortic aneurysm (AAA) screening. You may need this if you are a current or former smoker.  Osteoporosis. You may be screened starting at age 2  if you are at high risk. Talk with your health care provider about your test results, treatment options, and if necessary, the need for more tests. Vaccines  Your health care provider may recommend certain vaccines, such as:  Influenza vaccine. This is recommended  every year.  Tetanus, diphtheria, and acellular pertussis (Tdap, Td) vaccine. You may need a Td booster every 10 years.  Zoster vaccine. You may need this after age 56.  Pneumococcal 13-valent conjugate (PCV13) vaccine. One dose is recommended after age 15.  Pneumococcal polysaccharide (PPSV23) vaccine. One dose is recommended after age 44. Talk to your health care provider about which screenings and vaccines you need and how often you need them. This information is not intended to replace advice given to you by your health care provider. Make sure you discuss any questions you have with your health care provider. Document Released: 04/26/2015 Document Revised: 12/18/2015 Document Reviewed: 01/29/2015 Elsevier Interactive Patient Education  2017 [place] Prevention in the Home Falls can cause injuries. They can happen to people of all ages. There are many things you can do to make your home safe and to help prevent falls. What can I do on the outside of my home?  Regularly fix the edges of walkways and driveways and fix any cracks.  Remove anything that might make you trip as you walk through a door, such as a raised step or threshold.  Trim any bushes or trees on the path to your home.  Use bright outdoor lighting.  Clear any walking paths of anything that might make someone trip, such as rocks or tools.  Regularly check to see if handrails are loose or broken. Make sure that both sides of any steps have handrails.  Any raised decks and porches should have guardrails on the edges.  Have any leaves, snow, or ice cleared regularly.  Use sand or salt on walking paths during winter.  Clean up any spills in your garage right away. This includes oil or grease spills. What can I do in the bathroom?  Use night lights.  Install grab bars by the toilet and in the tub and shower. Do not use towel bars as grab bars.  Use non-skid mats or decals in the tub or shower.  If  you need to sit down in the shower, use a plastic, non-slip stool.  Keep the floor dry. Clean up any water that spills on the floor as soon as it happens.  Remove soap buildup in the tub or shower regularly.  Attach bath mats securely with double-sided non-slip rug tape.  Do not have throw rugs and other things on the floor that can make you trip. What can I do in the bedroom?  Use night lights.  Make sure that you have a light by your bed that is easy to reach.  Do not use any sheets or blankets that are too big for your bed. They should not hang down onto the floor.  Have a firm chair that has side arms. You can use this for support while you get dressed.  Do not have throw rugs and other things on the floor that can make you trip. What can I do in the kitchen?  Clean up any spills right away.  Avoid walking on wet floors.  Keep items that you use a lot in easy-to-reach places.  If you need to reach something above you, use a strong step stool that has a grab bar.  Keep electrical  cords out of the way.  Do not use floor polish or wax that makes floors slippery. If you must use wax, use non-skid floor wax.  Do not have throw rugs and other things on the floor that can make you trip. What can I do with my stairs?  Do not leave any items on the stairs.  Make sure that there are handrails on both sides of the stairs and use them. Fix handrails that are broken or loose. Make sure that handrails are as long as the stairways.  Check any carpeting to make sure that it is firmly attached to the stairs. Fix any carpet that is loose or worn.  Avoid having throw rugs at the top or bottom of the stairs. If you do have throw rugs, attach them to the floor with carpet tape.  Make sure that you have a light switch at the top of the stairs and the bottom of the stairs. If you do not have them, ask someone to add them for you. What else can I do to help prevent falls?  Wear shoes  that:  Do not have high heels.  Have rubber bottoms.  Are comfortable and fit you well.  Are closed at the toe. Do not wear sandals.  If you use a stepladder:  Make sure that it is fully opened. Do not climb a closed stepladder.  Make sure that both sides of the stepladder are locked into place.  Ask someone to hold it for you, if possible.  Clearly mark and make sure that you can see:  Any grab bars or handrails.  First and last steps.  Where the edge of each step is.  Use tools that help you move around (mobility aids) if they are needed. These include:  Canes.  Walkers.  Scooters.  Crutches.  Turn on the lights when you go into a dark area. Replace any light bulbs as soon as they burn out.  Set up your furniture so you have a clear path. Avoid moving your furniture around.  If any of your floors are uneven, fix them.  If there are any pets around you, be aware of where they are.  Review your medicines with your doctor. Some medicines can make you feel dizzy. This can increase your chance of falling. Ask your doctor what other things that you can do to help prevent falls. This information is not intended to replace advice given to you by your health care provider. Make sure you discuss any questions you have with your health care provider. Document Released: 01/24/2009 Document Revised: 09/05/2015 Document Reviewed: 05/04/2014 Elsevier Interactive Patient Education  2017 [employer].

## 2020-02-14 NOTE — Progress Notes (Signed)
Subjective:   Tony Leach is a 79 y.o. male who presents for Medicare Annual/Subsequent preventive examination.  I connected with Wyvonnia Dusky today by telephone and verified that I am speaking with the correct person using two identifiers. Location patient: home Location provider: work Persons participating in the virtual visit: patient, provider.   I discussed the limitations, risks, security and privacy concerns of performing an evaluation and management service by telephone and the availability of in person appointments. I also discussed with the patient that there may be a patient responsible charge related to this service. The patient expressed understanding and verbally consented to this telephonic visit.    Interactive audio and video telecommunications were attempted between this provider and patient, however failed, due to patient having technical difficulties OR patient did not have access to video capability.  We continued and completed visit with audio only.      Review of Systems    N/A Cardiac Risk Factors include: advanced age (>51men, >17 women);male gender;dyslipidemia     Objective:    Today's Vitals   There is no height or weight on file to calculate BMI.  Advanced Directives 02/14/2020 07/05/2014 06/21/2014  Does Patient Have a Medical Advance Directive? No No No  Would patient like information on creating a medical advance directive? No - Patient declined - -    Current Medications (verified) Outpatient Encounter Medications as of 02/14/2020  Medication Sig  . acetaminophen (TYLENOL) 500 MG tablet Take 1 tablet (500 mg total) by mouth every 6 (six) hours as needed.  Marland Kitchen aspirin 81 MG tablet Take 81 mg by mouth daily.    . dorzolamide-timolol (COSOPT) 22.3-6.8 MG/ML ophthalmic solution Place 1 drop into the left eye 2 (two) times daily.   . Multiple Vitamin (MULTIVITAMIN) tablet Take 1 tablet by mouth daily.    . sildenafil (VIAGRA) 50 MG tablet Take 1  tablet (50 mg total) by mouth daily as needed for erectile dysfunction.  . simvastatin (ZOCOR) 10 MG tablet Take 1 tablet (10 mg total) by mouth at bedtime.   No facility-administered encounter medications on file as of 02/14/2020.    Allergies (verified) Patient has no known allergies.   History: Past Medical History:  Diagnosis Date  . Cataract   . Glaucoma   . Hyperlipidemia    Past Surgical History:  Procedure Laterality Date  . COLONOSCOPY    . HERNIA REPAIR     Family History  Problem Relation Age of Onset  . Heart disease Father   . Colon cancer Brother   . Esophageal cancer Neg Hx   . Rectal cancer Neg Hx   . Stomach cancer Neg Hx    Social History   Socioeconomic History  . Marital status: Married    Spouse name: Not on file  . Number of children: Not on file  . Years of education: Not on file  . Highest education level: Not on file  Occupational History  . Not on file  Tobacco Use  . Smoking status: Never Smoker  . Smokeless tobacco: Never Used  Vaping Use  . Vaping Use: Never used  Substance and Sexual Activity  . Alcohol use: No  . Drug use: No  . Sexual activity: Not on file  Other Topics Concern  . Not on file  Social History Narrative  . Not on file   Social Determinants of Health   Financial Resource Strain: Low Risk   . Difficulty of Paying Living Expenses: Not hard at  all  Food Insecurity: No Food Insecurity  . Worried About Charity fundraiser in the Last Year: Never true  . Ran Out of Food in the Last Year: Never true  Transportation Needs: No Transportation Needs  . Lack of Transportation (Medical): No  . Lack of Transportation (Non-Medical): No  Physical Activity: Inactive  . Days of Exercise per Week: 0 days  . Minutes of Exercise per Session: 0 min  Stress: No Stress Concern Present  . Feeling of Stress : Not at all  Social Connections: Moderately Integrated  . Frequency of Communication with Friends and Family: More than  three times a week  . Frequency of Social Gatherings with Friends and Family: Twice a week  . Attends Religious Services: More than 4 times per year  . Active Member of Clubs or Organizations: No  . Attends Archivist Meetings: Never  . Marital Status: Married    Tobacco Counseling Counseling given: Not Answered   Clinical Intake:  Pre-visit preparation completed: Yes  Pain : No/denies pain     Nutritional Risks: None  How often do you need to have someone help you when you read instructions, pamphlets, or other written materials from your doctor or pharmacy?: 2 - Rarely What is the last grade level you completed in school?: 3rd Grade  Diabetic? No  Interpreter Needed?: No  Information entered by :: [place] of Daily Living In your present state of health, do you have any difficulty performing the following activities: 02/14/2020  Hearing? N  Vision? N  Difficulty concentrating or making decisions? N  Walking or climbing stairs? N  Dressing or bathing? N  Doing errands, shopping? N  Preparing Food and eating ? N  Using the Toilet? N  In the past six months, have you accidently leaked urine? N  Do you have problems with loss of bowel control? N  Managing your Medications? N  Managing your Finances? N  Housekeeping or managing your Housekeeping? N  Some recent data might be hidden    Patient Care Team: Eulas Post, MD as PCP - General (Family Medicine)  Indicate any recent Medical Services you may have received from other than [facility] providers in the past year (date may be approximate).     Assessment:   This is a routine wellness examination for [employer].  Hearing/Vision screen  Hearing Screening   125Hz  250Hz  500Hz  1000Hz  2000Hz  3000Hz  4000Hz  6000Hz  8000Hz   Right ear:           Left ear:           Vision Screening Comments: Patient states gets eyes checked every once every 6 months   Dietary issues and exercise activities  discussed: Current Exercise Habits: The patient does not participate in regular exercise at present  Goals    . Exercise 3x per week (30 min per time)      Depression Screen PHQ 2/9 Scores 02/14/2020 10/17/2019 10/17/2018 07/23/2015 03/22/2014  PHQ - 2 Score 0 0 0 0 0  PHQ- 9 Score 0 1 0 - -    Fall Risk Fall Risk  02/14/2020 10/17/2019 10/17/2018 11/11/2017 07/23/2015  Falls in the past year? 0 0 0 No No  Comment - - - Emmi Telephone Survey: data to providers prior to load -  Number falls in past yr: 0 0 - - -  Injury with Fall? 0 0 - - -  Risk for fall due to : No Fall Risks - - - -  Follow up Falls evaluation completed;Falls prevention discussed - - - -    Any stairs in or around the home? No  If so, are there any without handrails? No  Home free of loose throw rugs in walkways, pet beds, electrical cords, etc? Yes  Adequate lighting in your home to reduce risk of falls? Yes   ASSISTIVE DEVICES UTILIZED TO PREVENT FALLS:  Life alert? No  Use of a cane, walker or w/c? No  Grab bars in the bathroom? No  Shower chair or bench in shower? No  Elevated toilet seat or a handicapped toilet? No     Cognitive Function:  Cognition within normal limits based on direct observation.       Immunizations Immunization History  Administered Date(s) Administered  . Pneumococcal Conjugate-13 07/23/2015  . Pneumococcal Polysaccharide-23 10/17/2018  . Td 05/14/1996, 12/07/2006    TDAP status: Due, Education has been provided regarding the importance of this vaccine. Advised may receive this vaccine at local pharmacy or Health Dept. Aware to provide a copy of the vaccination record if obtained from local pharmacy or Health Dept. Verbalized acceptance and understanding. Flu Vaccine status: Declined, Education has been provided regarding the importance of this vaccine but patient still declined. Advised may receive this vaccine at local pharmacy or Health Dept. Aware to provide a copy of the  vaccination record if obtained from local pharmacy or Health Dept. Verbalized acceptance and understanding. Pneumococcal vaccine status: Up to date Covid-19 vaccine status: Declined, Education has been provided regarding the importance of this vaccine but patient still declined. Advised may receive this vaccine at local pharmacy or Health Dept.or vaccine clinic. Aware to provide a copy of the vaccination record if obtained from local pharmacy or Health Dept. Verbalized acceptance and understanding.  Qualifies for Shingles Vaccine? Yes   Zostavax completed No   Shingrix Completed?: No.    Education has been provided regarding the importance of this vaccine. Patient has been advised to call insurance company to determine out of pocket expense if they have not yet received this vaccine. Advised may also receive vaccine at local pharmacy or Health Dept. Verbalized acceptance and understanding.  Screening Tests Health Maintenance  Topic Date Due  . Hepatitis C Screening  Never done  . COVID-19 Vaccine (1) Never done  . TETANUS/TDAP  12/06/2016  . INFLUENZA VACCINE  Never done  . COLONOSCOPY  10/16/2020 (Originally 07/05/2019)  . PNA vac Low Risk Adult  Completed    Health Maintenance  Health Maintenance Due  Topic Date Due  . Hepatitis C Screening  Never done  . COVID-19 Vaccine (1) Never done  . TETANUS/TDAP  12/06/2016  . INFLUENZA VACCINE  Never done    Colorectal cancer screening: Completed 07/05/2014. Repeat every 5 years  Lung Cancer Screening: (Low Dose CT Chest recommended if Age 17-80 years, 30 pack-year currently smoking OR have quit w/in 15years.) does not qualify.   Lung Cancer Screening Referral: N/A   Additional Screening:  Hepatitis C Screening: does qualify;   Vision Screening: Recommended annual ophthalmology exams for early detection of glaucoma and other disorders of the eye. Is the patient up to date with their annual eye exam?  Yes  Who is the provider or what  is the name of the office in which the patient attends annual eye exams? Dr. Katy Fitch  If pt is not established with a provider, would they like to be referred to a provider to establish care? No .   Dental Screening: Recommended annual  dental exams for proper oral hygiene  Community Resource Referral / Chronic Care Management: CRR required this visit?  No   CCM required this visit?  No      Plan:     I have personally reviewed and noted the following in the patient's chart:   . Medical and social history . Use of alcohol, tobacco or illicit drugs  . Current medications and supplements . Functional ability and status . Nutritional status . Physical activity . Advanced directives . List of other physicians . Hospitalizations, surgeries, and ER visits in previous 12 months . Vitals . Screenings to include cognitive, depression, and falls . Referrals and appointments  In addition, I have reviewed and discussed with patient certain preventive protocols, quality metrics, and best practice recommendations. A written personalized care plan for preventive services as well as general preventive health recommendations were provided to patient.     Ofilia Neas, LPN   19/07/1738   Nurse Notes: None

## 2020-05-01 DIAGNOSIS — H40052 Ocular hypertension, left eye: Secondary | ICD-10-CM | POA: Diagnosis not present

## 2020-05-01 DIAGNOSIS — H25813 Combined forms of age-related cataract, bilateral: Secondary | ICD-10-CM | POA: Diagnosis not present

## 2020-10-29 DIAGNOSIS — H40052 Ocular hypertension, left eye: Secondary | ICD-10-CM | POA: Diagnosis not present

## 2020-10-29 DIAGNOSIS — H25813 Combined forms of age-related cataract, bilateral: Secondary | ICD-10-CM | POA: Diagnosis not present

## 2020-11-27 DIAGNOSIS — H25813 Combined forms of age-related cataract, bilateral: Secondary | ICD-10-CM | POA: Diagnosis not present

## 2020-11-27 DIAGNOSIS — H40052 Ocular hypertension, left eye: Secondary | ICD-10-CM | POA: Diagnosis not present

## 2021-06-11 ENCOUNTER — Ambulatory Visit (INDEPENDENT_AMBULATORY_CARE_PROVIDER_SITE_OTHER): Payer: Medicare Other | Admitting: Family Medicine

## 2021-06-11 ENCOUNTER — Encounter: Payer: Self-pay | Admitting: Family Medicine

## 2021-06-11 VITALS — BP 124/72 | HR 68 | Temp 98.7°F | Resp 16 | Wt 158.8 lb

## 2021-06-11 DIAGNOSIS — F03C18 Unspecified dementia, severe, with other behavioral disturbance: Secondary | ICD-10-CM | POA: Diagnosis not present

## 2021-06-11 DIAGNOSIS — R5383 Other fatigue: Secondary | ICD-10-CM

## 2021-06-11 DIAGNOSIS — R413 Other amnesia: Secondary | ICD-10-CM

## 2021-06-11 NOTE — Progress Notes (Signed)
Established Patient Office Visit  Subjective:  Patient ID: Tony Leach, male    DOB: August 02, 1940  Age: 81 y.o. MRN: [id number]  CC:  Chief Complaint  Patient presents with   Memory Loss    HPI Tony Leach is here today accompanied by daughter.  He has not been seen here in well over a year.  He has past history of known short-term memory loss, hyperlipidemia, glaucoma, cataracts.  He is followed by ophthalmology.  He has refused eye surgery.  He has not driven in over a year.  His daughter has brought up his concerns regarding his progressive cognitive decline.  Patient's wife died of complications of cancer February 9.  They had not been around him for extended periods of time until his wife died and he has 2 daughters and 1 son that all live here in [place] and they have noted his progressive cognitive changes since they have been staying with him.    They specifically have concerns about his inability to manage finances and day-to-day affairs.  He does not have any appointed medical power of attorney or DURABLE POWER OF ATTORNEY.  His daughter, [name], is with him today.  She works in nursing at [employer].  Patient has not had any recent labs.  He has had previous thyroid functions and B12 which were normal.  No recent CNS imaging.  No recent falls or head injury reported.  Apparently has good balance.  Decent appetite.  Weight minimally changed from visit couple years ago.  He had been on simvastatin but is declining any medications.  Occasional mild agitation and daughter states he also seems to have some intermittent mild paranoia.  She is convinced that he would refuse any medications at this point.  Family has significant concerns regarding his safety.  Fortunately, he has not driven in over a year and does not have any keys.  Daughter does think he has firearms but these are locked up.  He does not cook.  He does not have access to the stove.  Daughter recently met  with a lawyer and they basically need good documentation regarding his cognitive deficits in order to get help with his day-to-day management of finances, etc.   Past Medical History:  Diagnosis Date   Cataract    Glaucoma    Hyperlipidemia     Past Surgical History:  Procedure Laterality Date   COLONOSCOPY     HERNIA REPAIR      Family History  Problem Relation Age of Onset   Heart disease Father    Colon cancer Brother    Esophageal cancer Neg Hx    Rectal cancer Neg Hx    Stomach cancer Neg Hx     Social History   Socioeconomic History   Marital status: Married    Spouse name: Not on file   Number of children: Not on file   Years of education: Not on file   Highest education level: Not on file  Occupational History   Not on file  Tobacco Use   Smoking status: Never   Smokeless tobacco: Never  Vaping Use   Vaping Use: Never used  Substance and Sexual Activity   Alcohol use: No   Drug use: No   Sexual activity: Not on file  Other Topics Concern   Not on file  Social History Narrative   Not on file   Social Determinants of Health   Financial Resource Strain: Not on file  Food Insecurity: Not on file  Transportation Needs: Not on file  Physical Activity: Not on file  Stress: Not on file  Social Connections: Not on file  Intimate Partner Violence: Not on file    Outpatient Medications Prior to Visit  Medication Sig Dispense Refill   acetaminophen (TYLENOL) 500 MG tablet Take 1 tablet (500 mg total) by mouth every 6 (six) hours as needed. 30 tablet 0   aspirin 81 MG tablet Take 81 mg by mouth daily.       dorzolamide-timolol (COSOPT) 22.3-6.8 MG/ML ophthalmic solution Place 1 drop into the left eye 2 (two) times daily.      Multiple Vitamin (MULTIVITAMIN) tablet Take 1 tablet by mouth daily.       sildenafil (VIAGRA) 50 MG tablet Take 1 tablet (50 mg total) by mouth daily as needed for erectile dysfunction. 6 tablet 5   simvastatin (ZOCOR) 10 MG tablet  Take 1 tablet (10 mg total) by mouth at bedtime. 90 tablet 3   No facility-administered medications prior to visit.    No Known Allergies  ROS Review of Systems  Reliable history not obtainable from patient because of his advanced cognitive deficits.  His daughter relates that he has fair appetite.  He has not complained of any recent pain.  No recent falls.   Objective:    Physical Exam Vitals reviewed.  Eyes:     Comments: He has fairly dense cataracts bilaterally  Cardiovascular:     Rate and Rhythm: Normal rate and regular rhythm.  Pulmonary:     Effort: Pulmonary effort is normal.     Breath sounds: Normal breath sounds. No wheezing or rales.  Musculoskeletal:     Right lower leg: No edema.     Left lower leg: No edema.  Neurological:     General: No focal deficit present.     Mental Status: He is alert.  Psychiatric:     Comments: MMSE 11/30  0 out of 3 3 word recall.  Patient was oriented to place but not date, month, year, or day of week.  Unable to do clock drawing because of his vision impairment.    BP 124/72    Pulse 68    Temp 98.7 F (37.1 C)    Resp 16    Wt 158 lb 12.8 oz (72 kg)    SpO2 95%    BMI 24.15 kg/m  Wt Readings from Last 3 Encounters:  06/11/21 158 lb 12.8 oz (72 kg)  01/17/20 162 lb 11.2 oz (73.8 kg)  10/17/19 161 lb 11.2 oz (73.3 kg)     Health Maintenance Due  Topic Date Due   COVID-19 Vaccine (1) Never done   Hepatitis C Screening  Never done   Zoster Vaccines- Shingrix (1 of 2) Never done   TETANUS/TDAP  12/06/2016   COLONOSCOPY (Pts 45-25yrs Insurance coverage will need to be confirmed)  07/05/2019   INFLUENZA VACCINE  Never done    There are no preventive care reminders to display for this patient.  Lab Results  Component Value Date   TSH 0.96 07/23/2015   Lab Results  Component Value Date   WBC 6.8 07/23/2015   HGB 13.2 07/23/2015   HCT 39.3 07/23/2015   MCV 85.2 07/23/2015   PLT 292.0 07/23/2015   Lab Results   Component Value Date   NA 141 01/17/2020   K 4.4 01/17/2020   CO2 29 01/17/2020   GLUCOSE 99 01/17/2020   BUN 13 01/17/2020  CREATININE 0.97 01/17/2020   BILITOT 1.3 (H) 01/17/2020   ALKPHOS 47 10/17/2018   AST 14 01/17/2020   ALT 10 01/17/2020   PROT 6.5 01/17/2020   ALBUMIN 4.5 10/17/2018   CALCIUM 9.3 01/17/2020   GFR 86.69 10/17/2018   Lab Results  Component Value Date   CHOL 202 (H) 01/17/2020   Lab Results  Component Value Date   HDL 55 01/17/2020   Lab Results  Component Value Date   LDLCALC 130 (H) 01/17/2020   Lab Results  Component Value Date   TRIG 76 01/17/2020   Lab Results  Component Value Date   CHOLHDL 3.7 01/17/2020   No results found for: HGBA1C    Assessment & Plan:   Problem List Items Addressed This Visit   None Visit Diagnoses     Severe dementia with other behavioral disturbance, unspecified dementia type    -  Primary   Relevant Orders   TSH   Basic metabolic panel   CBC with Differential/Platelet   Vitamin B12   Fatigue, unspecified type       Relevant Orders   TSH   Memory changes       Relevant Orders   Vitamin B12     -Patient has obvious severe cognitive impairment which is probably been going on for years.  He scored 11/30 on MMSE. -At this level of cognitive impairment we would not expect him to be able to manage any of his day-to-day failure safely.  He is obviously unsafe to drive.  We also discussed other safety issues such as securing firearms. -Fortunately at this point he is not having much in terms of wandering behaviors or agitation.  We did discuss possible low-dose SSRI medication such as Lexapro but daughter thinks he would not be cooperative taking. -We did discuss getting some baseline labs including CBC, CMP, TSH, B12 -We did discuss possible neuroimaging such as MRI but he would not likely be very cooperative. -We have strongly advocated legal help to have children designated to help with his day-to-day  management and finances. -We have put together a letter in support of legal guardianship with family member to help with managing his day-to-day affairs.  -Over 40 minutes were spent reviewing his recent history, examining him, and discussing future challenges with his management  No orders of the defined types were placed in this encounter.   Follow-up: No follow-ups on file.    Carolann Littler, MD

## 2021-06-12 ENCOUNTER — Other Ambulatory Visit (INDEPENDENT_AMBULATORY_CARE_PROVIDER_SITE_OTHER): Payer: Medicare Other

## 2021-06-12 DIAGNOSIS — R5383 Other fatigue: Secondary | ICD-10-CM

## 2021-06-12 DIAGNOSIS — F03C18 Unspecified dementia, severe, with other behavioral disturbance: Secondary | ICD-10-CM | POA: Diagnosis not present

## 2021-06-12 DIAGNOSIS — R413 Other amnesia: Secondary | ICD-10-CM | POA: Diagnosis not present

## 2021-06-12 LAB — CBC WITH DIFFERENTIAL/PLATELET
Basophils Absolute: 0 10*3/uL (ref 0.0–0.1)
Basophils Relative: 0.6 % (ref 0.0–3.0)
Eosinophils Absolute: 0.1 10*3/uL (ref 0.0–0.7)
Eosinophils Relative: 2.4 % (ref 0.0–5.0)
HCT: 36.4 % — ABNORMAL LOW (ref 39.0–52.0)
Hemoglobin: 12.1 g/dL — ABNORMAL LOW (ref 13.0–17.0)
Lymphocytes Relative: 29.3 % (ref 12.0–46.0)
Lymphs Abs: 1.6 10*3/uL (ref 0.7–4.0)
MCHC: 33.2 g/dL (ref 30.0–36.0)
MCV: 86.9 fl (ref 78.0–100.0)
Monocytes Absolute: 0.5 10*3/uL (ref 0.1–1.0)
Monocytes Relative: 9.5 % (ref 3.0–12.0)
Neutro Abs: 3.3 10*3/uL (ref 1.4–7.7)
Neutrophils Relative %: 58.2 % (ref 43.0–77.0)
Platelets: 275 10*3/uL (ref 150.0–400.0)
RBC: 4.18 Mil/uL — ABNORMAL LOW (ref 4.22–5.81)
RDW: 14.4 % (ref 11.5–15.5)
WBC: 5.6 10*3/uL (ref 4.0–10.5)

## 2021-06-12 LAB — BASIC METABOLIC PANEL
BUN: 13 mg/dL (ref 6–23)
CO2: 29 mEq/L (ref 19–32)
Calcium: 9.6 mg/dL (ref 8.4–10.5)
Chloride: 102 mEq/L (ref 96–112)
Creatinine, Ser: 1 mg/dL (ref 0.40–1.50)
GFR: 71.56 mL/min (ref >60.00)
Glucose, Bld: 100 mg/dL — ABNORMAL HIGH (ref 70–99)
Potassium: 4.4 mEq/L (ref 3.5–5.1)
Sodium: 137 mEq/L (ref 135–145)

## 2021-06-12 LAB — TSH: TSH: 2.08 u[IU]/mL (ref 0.35–5.50)

## 2021-06-12 LAB — VITAMIN B12: Vitamin B-12: 174 pg/mL — ABNORMAL LOW (ref 211–911)

## 2021-06-18 ENCOUNTER — Telehealth: Payer: Self-pay | Admitting: Family Medicine

## 2021-06-18 NOTE — Telephone Encounter (Signed)
Spoke with daughter.   See lab note ?

## 2021-06-18 NOTE — Telephone Encounter (Signed)
[name] pt daughter is returning your call concerning blood work results ?

## 2021-06-24 DIAGNOSIS — H25813 Combined forms of age-related cataract, bilateral: Secondary | ICD-10-CM | POA: Diagnosis not present

## 2021-06-24 DIAGNOSIS — H40052 Ocular hypertension, left eye: Secondary | ICD-10-CM | POA: Diagnosis not present

## 2021-06-25 ENCOUNTER — Ambulatory Visit (INDEPENDENT_AMBULATORY_CARE_PROVIDER_SITE_OTHER): Payer: Medicare Other

## 2021-06-25 DIAGNOSIS — E538 Deficiency of other specified B group vitamins: Secondary | ICD-10-CM | POA: Diagnosis not present

## 2021-06-25 MED ORDER — CYANOCOBALAMIN 1000 MCG/ML IJ SOLN
1000.0000 ug | INTRAMUSCULAR | Status: AC
  Administered 2021-06-25 – 2021-07-16 (×4): 1000 ug via INTRAMUSCULAR

## 2021-06-25 NOTE — Progress Notes (Signed)
Pt here for weekly B12 injection #1 of 4 per Dr. Elease Hashimoto. ? ?B12 105mg given IM left deltoid and pt tolerated injection well. ? ?Next B12 injection scheduled for 07/02/21. ? ?

## 2021-07-02 ENCOUNTER — Ambulatory Visit (INDEPENDENT_AMBULATORY_CARE_PROVIDER_SITE_OTHER): Payer: Medicare Other

## 2021-07-02 DIAGNOSIS — E538 Deficiency of other specified B group vitamins: Secondary | ICD-10-CM

## 2021-07-02 NOTE — Progress Notes (Signed)
Pt here for weekly B12 injection #2 of 4 per Dr. Elease Hashimoto. ? ?B12 1076mg given IM right deltoid and pt tolerated injection well. ? ?Next B12 injection scheduled for 07/09/21. ? ?

## 2021-07-09 ENCOUNTER — Ambulatory Visit (INDEPENDENT_AMBULATORY_CARE_PROVIDER_SITE_OTHER): Payer: Medicare Other

## 2021-07-09 DIAGNOSIS — E538 Deficiency of other specified B group vitamins: Secondary | ICD-10-CM

## 2021-07-09 NOTE — Progress Notes (Signed)
Pt here for weekly B12 injection #3 of 4 per Dr. Elease Hashimoto. ? ?B12 101mg given IM left deltoid and pt tolerated injection well. ? ?Next B12 injection scheduled for 07/16/21. ? ?

## 2021-07-16 ENCOUNTER — Ambulatory Visit (INDEPENDENT_AMBULATORY_CARE_PROVIDER_SITE_OTHER): Payer: Medicare Other

## 2021-07-16 DIAGNOSIS — E538 Deficiency of other specified B group vitamins: Secondary | ICD-10-CM | POA: Diagnosis not present

## 2021-07-16 NOTE — Progress Notes (Signed)
Pt here for weekly B12 injection #4 of 4 per Dr. Elease Hashimoto. Educated on now taking oral OTC vitamin b12 1071mg daily. Daughter verb understanding. ? ?B12 10035m given right deltoid and pt tolerated injection well. ? ?Next OV scheduled for 10/22/21. ? ?

## 2021-08-13 ENCOUNTER — Telehealth: Payer: Self-pay | Admitting: Family Medicine

## 2021-09-26 NOTE — Telephone Encounter (Signed)
error 

## 2021-10-22 ENCOUNTER — Ambulatory Visit (INDEPENDENT_AMBULATORY_CARE_PROVIDER_SITE_OTHER): Payer: Medicare Other | Admitting: Family Medicine

## 2021-10-22 ENCOUNTER — Encounter: Payer: Self-pay | Admitting: Family Medicine

## 2021-10-22 ENCOUNTER — Ambulatory Visit (INDEPENDENT_AMBULATORY_CARE_PROVIDER_SITE_OTHER): Payer: Medicare Other

## 2021-10-22 VITALS — BP 116/60 | HR 60 | Temp 98.0°F | Ht 68.0 in | Wt 166.0 lb

## 2021-10-22 VITALS — BP 116/60 | HR 60 | Temp 98.0°F | Ht 68.0 in | Wt 166.9 lb

## 2021-10-22 DIAGNOSIS — F03C0 Unspecified dementia, severe, without behavioral disturbance, psychotic disturbance, mood disturbance, and anxiety: Secondary | ICD-10-CM | POA: Diagnosis not present

## 2021-10-22 DIAGNOSIS — Z0000 Encounter for general adult medical examination without abnormal findings: Secondary | ICD-10-CM

## 2021-10-22 DIAGNOSIS — E538 Deficiency of other specified B group vitamins: Secondary | ICD-10-CM | POA: Diagnosis not present

## 2021-10-22 DIAGNOSIS — F0390 Unspecified dementia without behavioral disturbance: Secondary | ICD-10-CM | POA: Insufficient documentation

## 2021-10-22 LAB — VITAMIN B12: Vitamin B-12: 213 pg/mL (ref 211–911)

## 2021-10-22 NOTE — Progress Notes (Signed)
Subjective:   Tony Leach is a 81 y.o. male who presents for Medicare Annual/Subsequent preventive examination.  Review of Systems     Cardiac Risk Factors include: male gender;advanced age (>45mn, >>65women)     Objective:    Today's Vitals   10/22/21 1055  BP: 116/60  Pulse: 60  Temp: 98 F (36.7 C)  TempSrc: Oral  SpO2: 99%  Weight: 166 lb (75.3 kg)  Height: '5\' 8"'$  (1.727 m)   Body mass index is 25.24 kg/m.     10/22/2021   11:11 AM 02/14/2020    1:27 PM 07/05/2014    8:52 AM 06/21/2014    9:01 AM  Advanced Directives  Does Patient Have a Medical Advance Directive? No No No No  Would patient like information on creating a medical advance directive? No - Patient declined No - Patient declined      Current Medications (verified) No outpatient encounter medications on file as of 10/22/2021.   No facility-administered encounter medications on file as of 10/22/2021.    Allergies (verified) Patient has no known allergies.   History: Past Medical History:  Diagnosis Date   Cataract    Glaucoma    Hyperlipidemia    Past Surgical History:  Procedure Laterality Date   COLONOSCOPY     HERNIA REPAIR     Family History  Problem Relation Age of Onset   Heart disease Father    Colon cancer Brother    Esophageal cancer Neg Hx    Rectal cancer Neg Hx    Stomach cancer Neg Hx    Social History   Socioeconomic History   Marital status: Married    Spouse name: Not on file   Number of children: Not on file   Years of education: Not on file   Highest education level: Not on file  Occupational History   Not on file  Tobacco Use   Smoking status: Never   Smokeless tobacco: Never  Vaping Use   Vaping Use: Never used  Substance and Sexual Activity   Alcohol use: No   Drug use: No   Sexual activity: Not on file  Other Topics Concern   Not on file  Social History Narrative   Not on file   Social Determinants of Health   Financial Resource Strain: Low  Risk  (10/22/2021)   Overall Financial Resource Strain (CARDIA)    Difficulty of Paying Living Expenses: Not hard at all  Food Insecurity: No Food Insecurity (10/22/2021)   Hunger Vital Sign    Worried About Running Out of Food in the Last Year: Never true    RBeckleyin the Last Year: Never true  Transportation Needs: No Transportation Needs (10/22/2021)   PRAPARE - THydrologist(Medical): No    Lack of Transportation (Non-Medical): No  Physical Activity: Sufficiently Active (10/22/2021)   Exercise Vital Sign    Days of Exercise per Week: 7 days    Minutes of Exercise per Session: 150+ min  Stress: No Stress Concern Present (10/22/2021)   [place]   Feeling of Stress : Not at all  Social Connections: Moderately Integrated (10/22/2021)   Social Connection and Isolation Panel [NHANES]    Frequency of Communication with Friends and Family: More than three times a week    Frequency of Social Gatherings with Friends and Family: More than three times a week    Attends  Religious Services: More than 4 times per year    Active Member of Clubs or Organizations: Yes    Attends Archivist Meetings: More than 4 times per year    Marital Status: Widowed     Clinical Intake: How often do you need to have someone help you when you read instructions, pamphlets, or other written materials from your doctor or pharmacy?: 5 - Always (Family assist)  Diabetic?  No  Interpreter Needed?: NoActivities of Daily Living    10/22/2021   11:06 AM  In your present state of health, do you have any difficulty performing the following activities:  Hearing? 0  Vision? 0  Difficulty concentrating or making decisions? 1  Comment Dx Dementia  Walking or climbing stairs? 0  Dressing or bathing? 0  Doing errands, shopping? [place] and eating ? Y  Comment Family Assist   Using the Toilet? N  In the past six months, have you accidently leaked urine? N  Do you have problems with loss of bowel control? N  Managing your Medications? Y  Comment Family Assist  Managing your Finances? Y  Comment Family Assist  Housekeeping or managing your Housekeeping? Y  Comment Family Assist    Patient Care Team: Eulas Post, MD as PCP - General (Family Medicine)  Indicate any recent Medical Services you may have received from other than [facility] providers in the past year (date may be approximate).     Assessment:   This is a routine wellness examination for Tony Leach.  Hearing/Vision screen Hearing Screening - Comments:: No hearing difficulty Vision Screening - Comments:: Wears glasses. Followed by Dr Schuyler Amor  Dietary issues and exercise activities discussed: Exercise limited by: None identified   Goals Addressed               This Visit's Progress     No current goals (pt-stated)         Depression Screen    10/22/2021   11:03 AM 06/11/2021    3:49 PM 02/14/2020    1:31 PM 10/17/2019    8:53 AM 10/17/2018   10:37 AM 07/23/2015   10:07 AM 03/22/2014   11:31 AM  PHQ 2/9 Scores  PHQ - 2 Score 0 0 0 0 0 0 0  PHQ- 9 Score  0 0 1 0      Fall Risk    10/22/2021   11:10 AM 06/11/2021    3:50 PM 02/14/2020    1:29 PM 10/17/2019    8:53 AM 10/17/2018   10:37 AM  Fall Risk   Falls in the past year? 0 0 0 0 0  Number falls in past yr: 0 0 0 0   Injury with Fall? 0 0 0 0   Risk for fall due to : No Fall Risks No Fall Risks No Fall Risks    Follow up   Falls evaluation completed;Falls prevention discussed      FALL RISK PREVENTION PERTAINING TO THE HOME:  Any stairs in or around the home? No  If so, are there any without handrails? No  Home free of loose throw rugs in walkways, pet beds, electrical cords, etc? Yes  Adequate lighting in your home to reduce risk of falls? Yes   ASSISTIVE DEVICES UTILIZED TO PREVENT FALLS:  Life alert? No  Use of a cane,  walker or w/c? No  Grab bars in the bathroom? No  Shower chair or bench in shower? No  Elevated toilet seat or a handicapped toilet? No   TIMED UP AND GO:  Was the test performed? Yes Length of time to ambulate 10 feet: 5 sec.   Gait slow and steady without use of assistive device  Cognitive Function:        10/22/2021   11:11 AM  6CIT Screen  What Year? 4 points  What month? 3 points  What time? 3 points  Count back from 20 4 points  Months in reverse 4 points  Repeat phrase 10 points  Total Score 28 points    Immunizations Immunization History  Administered Date(s) Administered   Pneumococcal Conjugate-13 07/23/2015   Pneumococcal Polysaccharide-23 10/17/2018   Td 05/14/1996, 12/07/2006    TDAP status: Due, Education has been provided regarding the importance of this vaccine. Advised may receive this vaccine at local pharmacy or Health Dept. Aware to provide a copy of the vaccination record if obtained from local pharmacy or Health Dept. Verbalized acceptance and understanding.  Flu Vaccine status: Declined, Education has been provided regarding the importance of this vaccine but patient still declined. Advised may receive this vaccine at local pharmacy or Health Dept. Aware to provide a copy of the vaccination record if obtained from local pharmacy or Health Dept. Verbalized acceptance and understanding.  Pneumococcal vaccine status: Up to date  Covid-19 vaccine status: Declined, Education has been provided regarding the importance of this vaccine but patient still declined. Advised may receive this vaccine at local pharmacy or Health Dept.or vaccine clinic. Aware to provide a copy of the vaccination record if obtained from local pharmacy or Health Dept. Verbalized acceptance and understanding.  Qualifies for Shingles Vaccine? Yes   Zostavax completed No   Shingrix Completed?: No.    Education has been provided regarding the importance of this vaccine. Patient has been  advised to call insurance company to determine out of pocket expense if they have not yet received this vaccine. Advised may also receive vaccine at local pharmacy or Health Dept. Verbalized acceptance and understanding.  Screening Tests Health Maintenance  Topic Date Due   COVID-19 Vaccine (1) Never done   Hepatitis C Screening  Never done   COLONOSCOPY (Pts 45-41yr Insurance coverage will need to be confirmed)  07/05/2019   Zoster Vaccines- Shingrix (1 of 2) 01/22/2022 (Originally 11/21/1991)   TETANUS/TDAP  10/23/2022 (Originally 12/06/2016)   INFLUENZA VACCINE  11/11/2021   Pneumonia Vaccine 80 Years old  Completed   HPV VACCINES  Aged Out    Health Maintenance  Health Maintenance Due  Topic Date Due   COVID-19 Vaccine (1) Never done   Hepatitis C Screening  Never done   COLONOSCOPY (Pts 45-427yrInsurance coverage will need to be confirmed)  07/05/2019    Colorectal cancer screening: No longer required.   Lung Cancer Screening: (Low Dose CT Chest recommended if Age 80-80ears, 30 pack-year currently smoking OR have quit w/in 15years.) does not qualify.     Additional Screening:  Hepatitis C Screening: does not qualify; Completed   Vision Screening: Recommended annual ophthalmology exams for early detection of glaucoma and other disorders of the eye. Is the patient up to date with their annual eye exam?  Yes  Who is the provider or what is the name of the office in which the patient attends annual eye exams? Dr GrSchuyler Amorf pt is not established with a provider, would they like to be referred to a provider to establish care? No .   Dental Screening: Recommended annual dental  exams for proper oral hygiene  Community Resource Referral / Chronic Care Management:   CRR required this visit?  No   CCM required this visit?  No      Plan:     I have personally reviewed and noted the following in the patient's chart:   Medical and social history Use of alcohol, tobacco  or illicit drugs  Current medications and supplements including opioid prescriptions. Patient is not currently taking opioid prescriptions. Functional ability and status Nutritional status Physical activity Advanced directives List of other physicians Hospitalizations, surgeries, and ER visits in previous 12 months Vitals Screenings to include cognitive, depression, and falls Referrals and appointments  In addition, I have reviewed and discussed with patient certain preventive protocols, quality metrics, and best practice recommendations. A written personalized care plan for preventive services as well as general preventive health recommendations were provided to patient.     Criselda Peaches, LPN   3/72/9021   Nurse Notes: None

## 2021-10-22 NOTE — Progress Notes (Signed)
Established Patient Office Visit  Subjective   Patient ID: Tony Leach, male    DOB: 08/10/1940  Age: 81 y.o. MRN: [id number]  Chief Complaint  Patient presents with   Follow-up    HPI   Here accompanied by daughter with recent diagnosis of B12 deficiency.  He had B12 of 174.  We started injection but he refused apparently to take over-the-counter supplement.  He had 1 month of injections every week.  He has had better dietary intake and weight is up almost 9 pounds from last visit which they attribute to more diligent meal preparation.  Family is with him several hours per day and preparing lunch and supper for him.  Without family supervision he was forgetting to eat.  No recent reported falls.  He still gets out and walks about an hour per day.  Does no longer drive.  Daughter is requesting that we complete FMLA paperwork is about 2-3 times per months she has 2 leave work to check on him for various reasons.  Excellent family support.  We obtained other recent labs and he has some chronic mild anemia but electrolytes and TSH were normal.  No recent agitation  Past Medical History:  Diagnosis Date   Cataract    Glaucoma    Hyperlipidemia    Past Surgical History:  Procedure Laterality Date   COLONOSCOPY     HERNIA REPAIR      reports that he has never smoked. He has never used smokeless tobacco. He reports that he does not drink alcohol and does not use drugs. family history includes Colon cancer in his brother; Heart disease in his father. No Known Allergies  Review of Systems  Constitutional:  Negative for chills, fever, malaise/fatigue and weight loss.  Eyes:  Negative for blurred vision.  Respiratory:  Negative for shortness of breath.   Cardiovascular:  Negative for chest pain.  Neurological:  Negative for dizziness, weakness and headaches.      Objective:     BP 116/60 (BP Location: Left Arm, Patient Position: Sitting, Cuff Size: Normal)   Pulse 60    Temp 98 F (36.7 C) (Oral)   Ht '5\' 8"'$  (1.727 m)   Wt 166 lb 14.4 oz (75.7 kg)   SpO2 99%   BMI 25.38 kg/m  BP Readings from Last 3 Encounters:  10/22/21 116/60  06/11/21 124/72  01/17/20 134/70   Wt Readings from Last 3 Encounters:  10/22/21 166 lb 14.4 oz (75.7 kg)  06/11/21 158 lb 12.8 oz (72 kg)  01/17/20 162 lb 11.2 oz (73.8 kg)      Physical Exam Vitals reviewed.  Constitutional:      General: He is not in acute distress.    Appearance: Normal appearance.  HENT:     Head: Normocephalic and atraumatic.  Cardiovascular:     Rate and Rhythm: Normal rate and regular rhythm.  Pulmonary:     Effort: Pulmonary effort is normal.     Breath sounds: Normal breath sounds.  Musculoskeletal:     Right lower leg: No edema.     Left lower leg: No edema.  Neurological:     Mental Status: He is alert.      No results found for any visits on 10/22/21.    The 10-year ASCVD risk score (Arnett DK, et al., 2019) is: 12.9%    Assessment & Plan:   #1 advanced dementia.  Recent MMSE 11/30.  They are aware that medication such as Aricept or  Namenda would be of essentially no value at this stage.  They are working to ensure his safety.  Fortunately, no agitation  #2 low B12.  Received 1 month of weekly injections.  He has been refusing oral supplement thus far.  Recheck B12 level today.   No follow-ups on file.    Carolann Littler, MD

## 2021-10-22 NOTE — Patient Instructions (Signed)
Encourage him to take the B12 1,000 mcg daily.

## 2021-10-22 NOTE — Patient Instructions (Addendum)
[name] , Thank you for taking time to come for your Medicare Wellness Visit. I appreciate your ongoing commitment to your health goals. Please review the following plan we discussed and let me know if I can assist you in the future.   These are the goals we discussed:  Goals       Exercise 3x per week (30 min per time)      No current goals (pt-stated)        This is a list of the screening recommended for you and due dates:  Health Maintenance  Topic Date Due   COVID-19 Vaccine (1) Never done   Hepatitis C Screening: USPSTF Recommendation to screen - Ages 29-79 yo.  Never done   Colon Cancer Screening  07/05/2019   Zoster (Shingles) Vaccine (1 of 2) 01/22/2022*   Tetanus Vaccine  10/23/2022*   Flu Shot  11/11/2021   Pneumonia Vaccine  Completed   HPV Vaccine  Aged Out  *Topic was postponed. The date shown is not the original due date.   Advanced directives: No Currently in process  Conditions/risks identified: None  Next appointment: Follow up in one year for your annual wellness visit.    Preventive Care 27 Years and Older, Male Preventive care refers to lifestyle choices and visits with your health care provider that can promote health and wellness. What does preventive care include? A yearly physical exam. This is also called an annual well check. Dental exams once or twice a year. Routine eye exams. Ask your health care provider how often you should have your eyes checked. Personal lifestyle choices, including: Daily care of your teeth and gums. Regular physical activity. Eating a healthy diet. Avoiding tobacco and drug use. Limiting alcohol use. Practicing safe sex. Taking low doses of aspirin every day. Taking vitamin and mineral supplements as recommended by your health care provider. What happens during an annual well check? The services and screenings done by your health care provider during your annual well check will depend on your age, overall health,  lifestyle risk factors, and family history of disease. Counseling  Your health care provider may ask you questions about your: Alcohol use. Tobacco use. Drug use. Emotional well-being. Home and relationship well-being. Sexual activity. Eating habits. History of falls. Memory and ability to understand (cognition). Work and work Statistician. Screening  You may have the following tests or measurements: Height, weight, and BMI. Blood pressure. Lipid and cholesterol levels. These may be checked every 5 years, or more frequently if you are over 24 years old. Skin check. Lung cancer screening. You may have this screening every year starting at age 54 if you have a 30-pack-year history of smoking and currently smoke or have quit within the past 15 years. Fecal occult blood test (FOBT) of the stool. You may have this test every year starting at age 28. Flexible sigmoidoscopy or colonoscopy. You may have a sigmoidoscopy every 5 years or a colonoscopy every 10 years starting at age 49. Prostate cancer screening. Recommendations will vary depending on your family history and other risks. Hepatitis C blood test. Hepatitis B blood test. Sexually transmitted disease (STD) testing. Diabetes screening. This is done by checking your blood sugar (glucose) after you have not eaten for a while (fasting). You may have this done every 1-3 years. Abdominal aortic aneurysm (AAA) screening. You may need this if you are a current or former smoker. Osteoporosis. You may be screened starting at age 87 if you are at high  risk. Talk with your health care provider about your test results, treatment options, and if necessary, the need for more tests. Vaccines  Your health care provider may recommend certain vaccines, such as: Influenza vaccine. This is recommended every year. Tetanus, diphtheria, and acellular pertussis (Tdap, Td) vaccine. You may need a Td booster every 10 years. Zoster vaccine. You may need this  after age 57. Pneumococcal 13-valent conjugate (PCV13) vaccine. One dose is recommended after age 43. Pneumococcal polysaccharide (PPSV23) vaccine. One dose is recommended after age 57. Talk to your health care provider about which screenings and vaccines you need and how often you need them. This information is not intended to replace advice given to you by your health care provider. Make sure you discuss any questions you have with your health care provider. Document Released: 04/26/2015 Document Revised: 12/18/2015 Document Reviewed: 01/29/2015 Elsevier Interactive Patient Education  2017 [place] Prevention in the Home Falls can cause injuries. They can happen to people of all ages. There are many things you can do to make your home safe and to help prevent falls. What can I do on the outside of my home? Regularly fix the edges of walkways and driveways and fix any cracks. Remove anything that might make you trip as you walk through a door, such as a raised step or threshold. Trim any bushes or trees on the path to your home. Use bright outdoor lighting. Clear any walking paths of anything that might make someone trip, such as rocks or tools. Regularly check to see if handrails are loose or broken. Make sure that both sides of any steps have handrails. Any raised decks and porches should have guardrails on the edges. Have any leaves, snow, or ice cleared regularly. Use sand or salt on walking paths during winter. Clean up any spills in your garage right away. This includes oil or grease spills. What can I do in the bathroom? Use night lights. Install grab bars by the toilet and in the tub and shower. Do not use towel bars as grab bars. Use non-skid mats or decals in the tub or shower. If you need to sit down in the shower, use a plastic, non-slip stool. Keep the floor dry. Clean up any water that spills on the floor as soon as it happens. Remove soap buildup in the tub or  shower regularly. Attach bath mats securely with double-sided non-slip rug tape. Do not have throw rugs and other things on the floor that can make you trip. What can I do in the bedroom? Use night lights. Make sure that you have a light by your bed that is easy to reach. Do not use any sheets or blankets that are too big for your bed. They should not hang down onto the floor. Have a firm chair that has side arms. You can use this for support while you get dressed. Do not have throw rugs and other things on the floor that can make you trip. What can I do in the kitchen? Clean up any spills right away. Avoid walking on wet floors. Keep items that you use a lot in easy-to-reach places. If you need to reach something above you, use a strong step stool that has a grab bar. Keep electrical cords out of the way. Do not use floor polish or wax that makes floors slippery. If you must use wax, use non-skid floor wax. Do not have throw rugs and other things on the floor that can  make you trip. What can I do with my stairs? Do not leave any items on the stairs. Make sure that there are handrails on both sides of the stairs and use them. Fix handrails that are broken or loose. Make sure that handrails are as long as the stairways. Check any carpeting to make sure that it is firmly attached to the stairs. Fix any carpet that is loose or worn. Avoid having throw rugs at the top or bottom of the stairs. If you do have throw rugs, attach them to the floor with carpet tape. Make sure that you have a light switch at the top of the stairs and the bottom of the stairs. If you do not have them, ask someone to add them for you. What else can I do to help prevent falls? Wear shoes that: Do not have high heels. Have rubber bottoms. Are comfortable and fit you well. Are closed at the toe. Do not wear sandals. If you use a stepladder: Make sure that it is fully opened. Do not climb a closed stepladder. Make  sure that both sides of the stepladder are locked into place. Ask someone to hold it for you, if possible. Clearly mark and make sure that you can see: Any grab bars or handrails. First and last steps. Where the edge of each step is. Use tools that help you move around (mobility aids) if they are needed. These include: Canes. Walkers. Scooters. Crutches. Turn on the lights when you go into a dark area. Replace any light bulbs as soon as they burn out. Set up your furniture so you have a clear path. Avoid moving your furniture around. If any of your floors are uneven, fix them. If there are any pets around you, be aware of where they are. Review your medicines with your doctor. Some medicines can make you feel dizzy. This can increase your chance of falling. Ask your doctor what other things that you can do to help prevent falls. This information is not intended to replace advice given to you by your health care provider. Make sure you discuss any questions you have with your health care provider. Document Released: 01/24/2009 Document Revised: 09/05/2015 Document Reviewed: 05/04/2014 Elsevier Interactive Patient Education  2017 [employer].

## 2021-10-29 ENCOUNTER — Telehealth: Payer: Self-pay | Admitting: Family Medicine

## 2021-10-29 NOTE — Telephone Encounter (Signed)
FMLA form to be filled out--placed in Dr's folder.  Call [phone] upon completion.

## 2021-12-30 DIAGNOSIS — H401122 Primary open-angle glaucoma, left eye, moderate stage: Secondary | ICD-10-CM | POA: Diagnosis not present

## 2021-12-30 DIAGNOSIS — H25813 Combined forms of age-related cataract, bilateral: Secondary | ICD-10-CM | POA: Diagnosis not present

## 2022-01-08 DIAGNOSIS — H25811 Combined forms of age-related cataract, right eye: Secondary | ICD-10-CM | POA: Diagnosis not present

## 2022-01-08 DIAGNOSIS — H268 Other specified cataract: Secondary | ICD-10-CM | POA: Diagnosis not present

## 2022-01-18 DIAGNOSIS — H2512 Age-related nuclear cataract, left eye: Secondary | ICD-10-CM | POA: Diagnosis not present

## 2022-01-22 DIAGNOSIS — H401122 Primary open-angle glaucoma, left eye, moderate stage: Secondary | ICD-10-CM | POA: Diagnosis not present

## 2022-01-22 DIAGNOSIS — H2512 Age-related nuclear cataract, left eye: Secondary | ICD-10-CM | POA: Diagnosis not present

## 2022-03-18 DIAGNOSIS — H547 Unspecified visual loss: Secondary | ICD-10-CM | POA: Diagnosis not present

## 2022-10-28 ENCOUNTER — Ambulatory Visit: Payer: Medicare Other

## 2022-10-28 VITALS — BP 120/60 | HR 66 | Temp 98.6°F | Ht 68.4 in | Wt 172.4 lb

## 2022-10-28 DIAGNOSIS — Z0000 Encounter for general adult medical examination without abnormal findings: Secondary | ICD-10-CM

## 2022-10-28 NOTE — Progress Notes (Signed)
Subjective:   Tony Leach is a 82 y.o. male who presents for Medicare Annual/Subsequent preventive examination.  Visit Complete: In person  Patient Medicare AWV questionnaire was completed by the patient on  ; I have confirmed that all information answered by patient is correct and no changes since this date.  Review of Systems     Cardiac Risk Factors include: advanced age (>11men, >47 women);male gender     Objective:    Today's Vitals   10/28/22 0938  BP: 120/60  Pulse: 66  Temp: 98.6 F (37 C)  TempSrc: Oral  SpO2: 98%  Weight: 172 lb 6.4 oz (78.2 kg)  Height: 5' 8.4" (1.737 m)   Body mass index is 25.91 kg/m.     10/28/2022   10:00 AM 10/22/2021   11:11 AM 02/14/2020    1:27 PM 07/05/2014    8:52 AM 06/21/2014    9:01 AM  Advanced Directives  Does Patient Have a Medical Advance Directive? No No No No No  Would patient like information on creating a medical advance directive? No - Patient declined No - Patient declined No - Patient declined      Current Medications (verified) No outpatient encounter medications on file as of 10/28/2022.   No facility-administered encounter medications on file as of 10/28/2022.    Allergies (verified) Patient has no known allergies.   History: Past Medical History:  Diagnosis Date   Cataract    Glaucoma    Hyperlipidemia    Past Surgical History:  Procedure Laterality Date   COLONOSCOPY     HERNIA REPAIR     Family History  Problem Relation Age of Onset   Heart disease Father    Colon cancer Brother    Esophageal cancer Neg Hx    Rectal cancer Neg Hx    Stomach cancer Neg Hx    Social History   Socioeconomic History   Marital status: Married    Spouse name: Not on file   Number of children: Not on file   Years of education: Not on file   Highest education level: Not on file  Occupational History   Not on file  Tobacco Use   Smoking status: Never   Smokeless tobacco: Never  Vaping Use   Vaping  status: Never Used  Substance and Sexual Activity   Alcohol use: No   Drug use: No   Sexual activity: Not on file  Other Topics Concern   Not on file  Social History Narrative   Not on file   Social Determinants of Health   Financial Resource Strain: Low Risk  (10/28/2022)   Overall Financial Resource Strain (CARDIA)    Difficulty of Paying Living Expenses: Not hard at all  Food Insecurity: No Food Insecurity (10/28/2022)   Hunger Vital Sign    Worried About Running Out of Food in the Last Year: Never true    Ran Out of Food in the Last Year: Never true  Transportation Needs: No Transportation Needs (10/28/2022)   PRAPARE - Administrator, Civil Service (Medical): No    Lack of Transportation (Non-Medical): No  Physical Activity: Sufficiently Active (10/28/2022)   Exercise Vital Sign    Days of Exercise per Week: 7 days    Minutes of Exercise per Session: 60 min  Stress: No Stress Concern Present (10/28/2022)   [employer] of Occupational Health - Occupational Stress Questionnaire    Feeling of Stress : Not at all  Social Connections: Moderately  Integrated (10/28/2022)   Social Connection and Isolation Panel [NHANES]    Frequency of Communication with Friends and Family: More than three times a week    Frequency of Social Gatherings with Friends and Family: More than three times a week    Attends Religious Services: More than 4 times per year    Active Member of [employer] or Organizations: Yes    Attends Banker Meetings: More than 4 times per year    Marital Status: Widowed    Tobacco Counseling Counseling given: Not Answered   Clinical Intake:  Pre-visit preparation completed: No  Pain : No/denies pain     BMI - recorded: 25.91 Nutritional Status: BMI 25 -29 Overweight Nutritional Risks: None Diabetes: No  How often do you need to have someone help you when you read instructions, pamphlets, or other written materials from your doctor or  pharmacy?: 5 - Always (Daughter assist)  Interpreter Needed?: No  Information entered by :: Theresa Mulligan LPN   Activities of Daily Living    10/28/2022    9:57 AM  In your present state of health, do you have any difficulty performing the following activities:  Hearing? 0  Vision? 0  Difficulty concentrating or making decisions? 1  Comment Dx: Dementia  Walking or climbing stairs? 0  Dressing or bathing? 0  Doing errands, shopping? 1  Comment Daughter Ship broker and eating ? Y  Comment Daughter assist  Using the Toilet? N  In the past six months, have you accidently leaked urine? N  Do you have problems with loss of bowel control? N  Managing your Medications? Y  Comment Daughter assist  Managing your Finances? Y  Comment Daughter assist  Housekeeping or managing your Housekeeping? Y  Comment Daughter assist    Patient Care Team: Kristian Covey, MD as PCP - General (Family Medicine)  Indicate any recent Medical Services you may have received from other than [facility] providers in the past year (date may be approximate).     Assessment:   This is a routine wellness examination for Tony Leach.  Hearing/Vision screen Hearing Screening - Comments:: Denies hearing difficulties   Vision Screening - Comments:: Wears rx glasses - up to date with routine eye exams with  Dr Dione Booze  Dietary issues and exercise activities discussed:     Goals Addressed               This Visit's Progress     Stay Healthy (pt-stated)         Depression Screen    10/28/2022    9:38 AM 10/22/2021   11:03 AM 06/11/2021    3:49 PM 02/14/2020    1:31 PM 10/17/2019    8:53 AM 10/17/2018   10:37 AM 07/23/2015   10:07 AM  PHQ 2/9 Scores  PHQ - 2 Score 0 0 0 0 0 0 0  PHQ- 9 Score   0 0 1 0     Fall Risk    10/28/2022    9:59 AM 10/22/2021   11:10 AM 06/11/2021    3:50 PM 02/14/2020    1:29 PM 10/17/2019    8:53 AM  Fall Risk   Falls in the past year? 0 0 0 0 0  Number falls in  past yr: 0 0 0 0 0  Injury with Fall? 0 0 0 0 0  Risk for fall due to : No Fall Risks No Fall Risks No Fall Risks No Fall Risks  Follow up Falls prevention discussed   Falls evaluation completed;Falls prevention discussed     MEDICARE RISK AT HOME:  Medicare Risk at Home - 10/28/22 1006     Any stairs in or around the home? No    If so, are there any without handrails? No    Home free of loose throw rugs in walkways, pet beds, electrical cords, etc? Yes    Adequate lighting in your home to reduce risk of falls? Yes    Life alert? No    Use of a cane, walker or w/c? No    Grab bars in the bathroom? No    Shower chair or bench in shower? No    Elevated toilet seat or a handicapped toilet? No             TIMED UP AND GO:  Was the test performed?  Yes  Length of time to ambulate 10 feet: 10 sec Gait slow and steady without use of assistive device    Cognitive Function:        10/28/2022   10:00 AM 10/22/2021   11:11 AM  6CIT Screen  What Year? -- 4 points  What month? -- 3 points  What time? -- 3 points  Count back from 20 -- 4 points  Months in reverse -- 4 points  Repeat phrase -- 10 points  Total Score  28 points    Immunizations Immunization History  Administered Date(s) Administered   Pneumococcal Conjugate-13 07/23/2015   Pneumococcal Polysaccharide-23 10/17/2018   Td 05/14/1996, 12/07/2006    TDAP status: Due, Education has been provided regarding the importance of this vaccine. Advised may receive this vaccine at local pharmacy or Health Dept. Aware to provide a copy of the vaccination record if obtained from local pharmacy or Health Dept. Verbalized acceptance and understanding.  Flu Vaccine status: Up to date  Pneumococcal vaccine status: Up to date  Covid-19 vaccine status: Declined, Education has been provided regarding the importance of this vaccine but patient still declined. Advised may receive this vaccine at local pharmacy or Health Dept.or  vaccine clinic. Aware to provide a copy of the vaccination record if obtained from local pharmacy or Health Dept. Verbalized acceptance and understanding.  Qualifies for Shingles Vaccine? Yes   Zostavax completed No   Shingrix Completed?: No.    Education has been provided regarding the importance of this vaccine. Patient has been advised to call insurance company to determine out of pocket expense if they have not yet received this vaccine. Advised may also receive vaccine at local pharmacy or Health Dept. Verbalized acceptance and understanding.  Screening Tests Health Maintenance  Topic Date Due   DTaP/Tdap/Td (3 - Tdap) 12/06/2016   COVID-19 Vaccine (1 - 2023-24 season) 11/13/2022 (Originally 12/12/2021)   Zoster Vaccines- Shingrix (1 of 2) 01/28/2023 (Originally 11/21/1991)   Colonoscopy  10/28/2023 (Originally 07/05/2019)   INFLUENZA VACCINE  11/12/2022   Medicare Annual Wellness (AWV)  10/28/2023   Pneumonia Vaccine 18+ Years old  Completed   HPV VACCINES  Aged Out    Health Maintenance  Health Maintenance Due  Topic Date Due   DTaP/Tdap/Td (3 - Tdap) 12/06/2016    Colorectal cancer screening: No longer required.   Lung Cancer Screening: (Low Dose CT Chest recommended if Age 81-80 years, 20 pack-year currently smoking OR have quit w/in 15years.) does not qualify.     Additional Screening:  Hepatitis C Screening: does not qualify; Completed   Vision Screening: Recommended annual ophthalmology exams  for early detection of glaucoma and other disorders of the eye. Is the patient up to date with their annual eye exam?  Yes  Who is the provider or what is the name of the office in which the patient attends annual eye exams? Dr Dione Booze If pt is not established with a provider, would they like to be referred to a provider to establish care? No .   Dental Screening: Recommended annual dental exams for proper oral hygiene    Community Resource Referral / Chronic Care  Management:  CRR required this visit?  No   CCM required this visit?  No     Plan:     I have personally reviewed and noted the following in the patient's chart:   Medical and social history Use of alcohol, tobacco or illicit drugs  Current medications and supplements including opioid prescriptions. Patient is not currently taking opioid prescriptions. Functional ability and status Nutritional status Physical activity Advanced directives List of other physicians Hospitalizations, surgeries, and ER visits in previous 12 months Vitals Screenings to include cognitive, depression, and falls Referrals and appointments  In addition, I have reviewed and discussed with patient certain preventive protocols, quality metrics, and best practice recommendations. A written personalized care plan for preventive services as well as general preventive health recommendations were provided to patient.     Tillie Rung, LPN   7/32/2025   After Visit Summary: Given  Nurse Notes: None

## 2022-10-28 NOTE — Patient Instructions (Addendum)
Tony Leach , Thank you for taking time to come for your Medicare Wellness Visit. I appreciate your ongoing commitment to your health goals. Please review the following plan we discussed and let me know if I can assist you in the future.   These are the goals we discussed:  Goals       Exercise 3x per week (30 min per time)      No current goals (pt-stated)      Stay Healthy (pt-stated)        This is a list of the screening recommended for you and due dates:  Health Maintenance  Topic Date Due   DTaP/Tdap/Td vaccine (3 - Tdap) 12/06/2016   COVID-19 Vaccine (1 - 2023-24 season) 11/13/2022*   Zoster (Shingles) Vaccine (1 of 2) 01/28/2023*   Colon Cancer Screening  10/28/2023*   Flu Shot  11/12/2022   Medicare Annual Wellness Visit  10/28/2023   Pneumonia Vaccine  Completed   HPV Vaccine  Aged Out  *Topic was postponed. The date shown is not the original due date.    Advanced directives: Advance directive discussed with you today. Even though you declined this today, please call our office should you change your mind, and we can give you the proper paperwork for you to fill out.   Conditions/risks identified: None  Next appointment: Follow up in one year for your annual wellness visit.   Preventive Care 25 Years and Older, Male  Preventive care refers to lifestyle choices and visits with your health care provider that can promote health and wellness. What does preventive care include? A yearly physical exam. This is also called an annual well check. Dental exams once or twice a year. Routine eye exams. Ask your health care provider how often you should have your eyes checked. Personal lifestyle choices, including: Daily care of your teeth and gums. Regular physical activity. Eating a healthy diet. Avoiding tobacco and drug use. Limiting alcohol use. Practicing safe sex. Taking low doses of aspirin every day. Taking vitamin and mineral supplements as recommended by your  health care provider. What happens during an annual well check? The services and screenings done by your health care provider during your annual well check will depend on your age, overall health, lifestyle risk factors, and family history of disease. Counseling  Your health care provider may ask you questions about your: Alcohol use. Tobacco use. Drug use. Emotional well-being. Home and relationship well-being. Sexual activity. Eating habits. History of falls. Memory and ability to understand (cognition). Work and work Astronomer. Screening  You may have the following tests or measurements: Height, weight, and BMI. Blood pressure. Lipid and cholesterol levels. These may be checked every 5 years, or more frequently if you are over 38 years old. Skin check. Lung cancer screening. You may have this screening every year starting at age 82 if you have a 30-pack-year history of smoking and currently smoke or have quit within the past 15 years. Fecal occult blood test (FOBT) of the stool. You may have this test every year starting at age 40. Flexible sigmoidoscopy or colonoscopy. You may have a sigmoidoscopy every 5 years or a colonoscopy every 10 years starting at age 14. Prostate cancer screening. Recommendations will vary depending on your family history and other risks. Hepatitis C blood test. Hepatitis B blood test. Sexually transmitted disease (STD) testing. Diabetes screening. This is done by checking your blood sugar (glucose) after you have not eaten for a while (fasting). You may have  this done every 1-3 years. Abdominal aortic aneurysm (AAA) screening. You may need this if you are a current or former smoker. Osteoporosis. You may be screened starting at age 22 if you are at high risk. Talk with your health care provider about your test results, treatment options, and if necessary, the need for more tests. Vaccines  Your health care provider may recommend certain vaccines, such  as: Influenza vaccine. This is recommended every year. Tetanus, diphtheria, and acellular pertussis (Tdap, Td) vaccine. You may need a Td booster every 10 years. Zoster vaccine. You may need this after age 77. Pneumococcal 13-valent conjugate (PCV13) vaccine. One dose is recommended after age 44. Pneumococcal polysaccharide (PPSV23) vaccine. One dose is recommended after age 109. Talk to your health care provider about which screenings and vaccines you need and how often you need them. This information is not intended to replace advice given to you by your health care provider. Make sure you discuss any questions you have with your health care provider. Document Released: 04/26/2015 Document Revised: 12/18/2015 Document Reviewed: 01/29/2015 Elsevier Interactive Patient Education  2017 [employer].  Fall Prevention in the Home Falls can cause injuries. They can happen to people of all ages. There are many things you can do to make your home safe and to help prevent falls. What can I do on the outside of my home? Regularly fix the edges of walkways and driveways and fix any cracks. Remove anything that might make you trip as you walk through a door, such as a raised step or threshold. Trim any bushes or trees on the path to your home. Use bright outdoor lighting. Clear any walking paths of anything that might make someone trip, such as rocks or tools. Regularly check to see if handrails are loose or broken. Make sure that both sides of any steps have handrails. Any raised decks and porches should have guardrails on the edges. Have any leaves, snow, or ice cleared regularly. Use sand or salt on walking paths during winter. Clean up any spills in your garage right away. This includes oil or grease spills. What can I do in the bathroom? Use night lights. Install grab bars by the toilet and in the tub and shower. Do not use towel bars as grab bars. Use non-skid mats or decals in the tub or  shower. If you need to sit down in the shower, use a plastic, non-slip stool. Keep the floor dry. Clean up any water that spills on the floor as soon as it happens. Remove soap buildup in the tub or shower regularly. Attach bath mats securely with double-sided non-slip rug tape. Do not have throw rugs and other things on the floor that can make you trip. What can I do in the bedroom? Use night lights. Make sure that you have a light by your bed that is easy to reach. Do not use any sheets or blankets that are too big for your bed. They should not hang down onto the floor. Have a firm chair that has side arms. You can use this for support while you get dressed. Do not have throw rugs and other things on the floor that can make you trip. What can I do in the kitchen? Clean up any spills right away. Avoid walking on wet floors. Keep items that you use a lot in easy-to-reach places. If you need to reach something above you, use a strong step stool that has a grab bar. Keep electrical cords out  of the way. Do not use floor polish or wax that makes floors slippery. If you must use wax, use non-skid floor wax. Do not have throw rugs and other things on the floor that can make you trip. What can I do with my stairs? Do not leave any items on the stairs. Make sure that there are handrails on both sides of the stairs and use them. Fix handrails that are broken or loose. Make sure that handrails are as long as the stairways. Check any carpeting to make sure that it is firmly attached to the stairs. Fix any carpet that is loose or worn. Avoid having throw rugs at the top or bottom of the stairs. If you do have throw rugs, attach them to the floor with carpet tape. Make sure that you have a light switch at the top of the stairs and the bottom of the stairs. If you do not have them, ask someone to add them for you. What else can I do to help prevent falls? Wear shoes that: Do not have high heels. Have  rubber bottoms. Are comfortable and fit you well. Are closed at the toe. Do not wear sandals. If you use a stepladder: Make sure that it is fully opened. Do not climb a closed stepladder. Make sure that both sides of the stepladder are locked into place. Ask someone to hold it for you, if possible. Clearly mark and make sure that you can see: Any grab bars or handrails. First and last steps. Where the edge of each step is. Use tools that help you move around (mobility aids) if they are needed. These include: Canes. Walkers. Scooters. Crutches. Turn on the lights when you go into a dark area. Replace any light bulbs as soon as they burn out. Set up your furniture so you have a clear path. Avoid moving your furniture around. If any of your floors are uneven, fix them. If there are any pets around you, be aware of where they are. Review your medicines with your doctor. Some medicines can make you feel dizzy. This can increase your chance of falling. Ask your doctor what other things that you can do to help prevent falls. This information is not intended to replace advice given to you by your health care provider. Make sure you discuss any questions you have with your health care provider. Document Released: 01/24/2009 Document Revised: 09/05/2015 Document Reviewed: 05/04/2014 Elsevier Interactive Patient Education  2017 [employer].

## 2023-02-06 ENCOUNTER — Ambulatory Visit (HOSPITAL_COMMUNITY)
Admission: EM | Admit: 2023-02-06 | Discharge: 2023-02-06 | Disposition: A | Discharge status: Home / Self Care | Payer: Medicare Other

## 2023-02-06 ENCOUNTER — Ambulatory Visit (HOSPITAL_COMMUNITY): Payer: Medicare Other

## 2023-02-06 ENCOUNTER — Encounter (HOSPITAL_COMMUNITY): Payer: Self-pay | Admitting: *Deleted

## 2023-02-06 DIAGNOSIS — M25551 Pain in right hip: Secondary | ICD-10-CM | POA: Diagnosis not present

## 2023-02-06 DIAGNOSIS — M160 Bilateral primary osteoarthritis of hip: Secondary | ICD-10-CM

## 2023-02-06 HISTORY — DX: Unspecified dementia, unspecified severity, without behavioral disturbance, psychotic disturbance, mood disturbance, and anxiety: F03.90

## 2023-02-06 MED ORDER — DICLOFENAC SODIUM 1 % EX GEL: 4.0000 g | Freq: Four times a day (QID) | CUTANEOUS | 0 refills | Status: AC

## 2023-02-06 NOTE — Discharge Instructions (Addendum)
Take OTC Tylenol 500 mg every 8 hours as needed for pain May use Voltaren gel for pain management Follow-up with PCP Return or Go to ED if you develop any new or worsening of your symptoms Call or go to the ED if you have any new or worsening symptoms such as fever, worsening cough, shortness of breath, chest tightness, chest pain, turning blue, changes in mental status, etc..Marland Kitchen

## 2023-02-06 NOTE — ED Triage Notes (Signed)
Pt with hx dementia. Per daughter and pt, pt started c/o RLE pain 3 days ago. Yesterday had been walking a lot, and pain became worse, causing pt to have very painful ambulation. Daughter states pt at times c/o pain to various areas of lateral RLE, and at times his lower back. Has tried some Tylenol last night. Pt ambulating with difficulty, using cane -- states normally does not use a cane.

## 2023-02-06 NOTE — ED Provider Notes (Addendum)
[facility]   [id number] 02/06/23 Arrival Time: 1012   Chief Complaint  Patient presents with   Leg Pain     SUBJECTIVE: History from: patient and family.  Tony Leach is a 82 y.o. male with history of dementia presents to the urgent care with a complaint of right lower legs pain for the past 3 days.  Daughter is the historian.  She reports patient normally is able to walk couple miles a day without pain.  For the past 3 days he is using a cane and reported pain to the right hip.  Patient is unable to tell if he had a fall or not.  Has tried OTC Tylenol with mild relief.  Reports he lives by himself and does not have 24-hour supervision.  Daughter stated  she work and able to come see him when  she is off.  Patient denies chills, fever, nausea, vomiting, headache.   ROS: As per HPI.  All other pertinent ROS negative.      Past Medical History:  Diagnosis Date   Cataract    Dementia (HCC)    Glaucoma    Hyperlipidemia    Past Surgical History:  Procedure Laterality Date   CATARACT EXTRACTION Bilateral    12/2021 & 01/2022   COLONOSCOPY     HERNIA REPAIR     No Known Allergies No current facility-administered medications on file prior to encounter.   Current Outpatient Medications on File Prior to Encounter  Medication Sig Dispense Refill   Cyanocobalamin (VITAMIN B-12 PO) Take by mouth.     Social History   Socioeconomic History   Marital status: Married    Spouse name: Not on file   Number of children: Not on file   Years of education: Not on file   Highest education level: Not on file  Occupational History   Not on file  Tobacco Use   Smoking status: Never   Smokeless tobacco: Never  Vaping Use   Vaping status: Never Used  Substance and Sexual Activity   Alcohol use: No   Drug use: Never   Sexual activity: Not on file  Other Topics Concern   Not on file  Social History Narrative   Not on file   Social Determinants of Health    Financial Resource Strain: Low Risk  (10/28/2022)   Overall Financial Resource Strain (CARDIA)    Difficulty of Paying Living Expenses: Not hard at all  Food Insecurity: No Food Insecurity (10/28/2022)   Hunger Vital Sign    Worried About Running Out of Food in the Last Year: Never true    Ran Out of Food in the Last Year: Never true  Transportation Needs: No Transportation Needs (10/28/2022)   PRAPARE - Administrator, Civil Service (Medical): No    Lack of Transportation (Non-Medical): No  Physical Activity: Sufficiently Active (10/28/2022)   Exercise Vital Sign    Days of Exercise per Week: 7 days    Minutes of Exercise per Session: 60 min  Stress: No Stress Concern Present (10/28/2022)   [employer] of Occupational Health - Occupational Stress Questionnaire    Feeling of Stress : Not at all  Social Connections: Moderately Integrated (10/28/2022)   Social Connection and Isolation Panel [NHANES]    Frequency of Communication with Friends and Family: More than three times a week    Frequency of Social Gatherings with Friends and Family: More than three times a week    Attends Religious Services:  More than 4 times per year    Active Member of Clubs or Organizations: Yes    Attends Banker Meetings: More than 4 times per year    Marital Status: Widowed  Intimate Partner Violence: Not At Risk (10/28/2022)   Humiliation, Afraid, Rape, and Kick questionnaire    Fear of Current or Ex-Partner: No    Emotionally Abused: No    Physically Abused: No    Sexually Abused: No   Family History  Problem Relation Age of Onset   Heart disease Father    Colon cancer Brother    Esophageal cancer Neg Hx    Rectal cancer Neg Hx    Stomach cancer Neg Hx     OBJECTIVE:  Vitals:   02/06/23 1157  BP: (!) 178/97  Pulse: 71  Resp: 18  Temp: 98.3 F (36.8 C)  TempSrc: Oral  SpO2: 98%     Physical Exam Vitals and nursing note reviewed.  Constitutional:       General: He is not in acute distress.    Appearance: Normal appearance. He is normal weight. He is not ill-appearing, toxic-appearing or diaphoretic.  Cardiovascular:     Rate and Rhythm: Normal rate and regular rhythm.     Pulses: Normal pulses.     Heart sounds: Normal heart sounds. No murmur heard.    No friction rub. No gallop.  Pulmonary:     Effort: Pulmonary effort is normal. No respiratory distress.     Breath sounds: Normal breath sounds. No stridor. No wheezing, rhonchi or rales.  Chest:     Chest wall: No tenderness.  Musculoskeletal:     Right upper leg: Tenderness present.     Left upper leg: Normal.     Comments: The right hip is without any obvious asymmetry or deformity when compared to the left knee.  Tenderness present while walking and bearing weight.  Neurological:     Mental Status: He is alert and oriented to person, place, and time.      LABS:  No results found for this or any previous visit (from the past 24 hour(s)).   RADIOLOGY:  DG Hip Unilat With Pelvis 2-3 Views Right  Result Date: 02/06/2023 CLINICAL DATA:  Right hip pain EXAM: DG HIP (WITH OR WITHOUT PELVIS) 2-3V RIGHT COMPARISON:  05/20/2015 FINDINGS: There is no evidence of hip fracture or dislocation. Mild bilateral and symmetric degenerative changes with subchondral sclerosis and marginal spur formation noted within both hips. IMPRESSION: 1. No acute findings. 2. Mild bilateral hip osteoarthritis. Electronically Signed   By: Signa Kell M.D.   On: 02/06/2023 13:50     X-ray is negative for bony a fracture or dislocation.  Mild bilateral hip osteoarthritis present.  I have reviewed the x-ray myself and the radiologist interpretation.  I am in agreement with the radiologist interpretation.   ASSESSMENT & PLAN:  1. Right hip pain   2. Primary osteoarthritis of both hips     Meds ordered this encounter  Medications   diclofenac Sodium (VOLTAREN) 1 % GEL    Sig: Apply 4 g topically 4 (four)  times daily for 22 days.    Dispense:  350 g    Refill:  0    Discharge instructions   Take OTC Tylenol 500 mg every 8 hours as needed for pain May use Voltaren gel for pain management Follow-up with PCP Return or Go to ED if you develop any new or worsening of your symptoms Call or  go to the ED if you have any new or worsening symptoms such as fever, worsening cough, shortness of breath, chest tightness, chest pain, turning blue, changes in mental status, etc...   Reviewed expectations re: course of current medical issues. Questions answered. Outlined signs and symptoms indicating need for more acute intervention. Patient verbalized understanding. After Visit Summary given.    Clinical Course as of 02/06/23 1854  Sat Feb 06, 2023  1307 DG Hip Unilat With Pelvis 2-3 Views Right [KA]    Clinical Course User Index [KA] Durward Parcel, FNP         Durward Parcel, FNP 02/06/23 1419    Durward Parcel, FNP 02/06/23 [phone]

## 2023-02-12 ENCOUNTER — Encounter: Payer: Self-pay | Admitting: Family Medicine

## 2023-02-12 ENCOUNTER — Ambulatory Visit: Payer: Medicare Other

## 2023-02-12 ENCOUNTER — Ambulatory Visit (INDEPENDENT_AMBULATORY_CARE_PROVIDER_SITE_OTHER): Payer: Medicare Other | Admitting: Family Medicine

## 2023-02-12 VITALS — BP 140/72 | HR 85 | Temp 98.0°F | Ht 68.4 in | Wt 170.4 lb

## 2023-02-12 DIAGNOSIS — M79604 Pain in right leg: Secondary | ICD-10-CM | POA: Diagnosis not present

## 2023-02-12 DIAGNOSIS — F03C0 Unspecified dementia, severe, without behavioral disturbance, psychotic disturbance, mood disturbance, and anxiety: Secondary | ICD-10-CM | POA: Diagnosis not present

## 2023-02-12 DIAGNOSIS — M25561 Pain in right knee: Secondary | ICD-10-CM | POA: Diagnosis not present

## 2023-02-12 DIAGNOSIS — R030 Elevated blood-pressure reading, without diagnosis of hypertension: Secondary | ICD-10-CM | POA: Diagnosis not present

## 2023-02-12 DIAGNOSIS — M79651 Pain in right thigh: Secondary | ICD-10-CM | POA: Diagnosis not present

## 2023-02-12 NOTE — Progress Notes (Unsigned)
Established Patient Office Visit  Subjective   Patient ID: Tony Leach, male    DOB: [birth year]  Age: 82 y.o. MRN: [id number]  Chief Complaint  Patient presents with   Hospitalization Follow-up    HPI  {History (Optional):23778} Tony Leach has history of dementia, glaucoma, hyperlipidemia.  Accompanied by daughter.  They relate he had at least 2-week history of right lower extremity pain.  Initially this was felt to be possibly hip related.  No reported fall or any other injury.  Went to urgent care on the 26th and x-ray showed only mild bilateral osteoarthritis but no acute bony abnormality.  They would prescribe topical Voltaren which has not helped at all.  They tried Tylenol with minimal relief and occasionally supplement with relief.  He seems to be okay at rest but every time he walks he seems to have difficulty.  He has been using a cane for assistance.  Did not note any visible swelling.  His pain seems to be today more distal femur just above the knee region.  No visible swelling of any part of the right lower extremity.  No cellulitis changes.  No warmth, erythema, or effusion around the knee region.  No visible skin rashes.  Currently does not take any regular prescription medications.  EXAM: DG HIP (WITH OR WITHOUT PELVIS) 2-3V RIGHT   COMPARISON:  05/20/2015   FINDINGS: There is no evidence of hip fracture or dislocation. Mild bilateral and symmetric degenerative changes with subchondral sclerosis and marginal spur formation noted within both hips.   IMPRESSION: 1. No acute findings. 2. Mild bilateral hip osteoarthrit  Past Medical History:  Diagnosis Date   Cataract    Dementia (HCC)    Glaucoma    Hyperlipidemia    Past Surgical History:  Procedure Laterality Date   CATARACT EXTRACTION Bilateral    12/2021 & 01/2022   COLONOSCOPY     HERNIA REPAIR      reports that he has never smoked. He has never used smokeless tobacco. He reports that he does not drink  alcohol and does not use drugs. family history includes Colon cancer in his brother; Heart disease in his father. No Known Allergies  ROS  Not obtainable reliably from patient.  History provided by daughter.   Objective:     BP (!) 140/72 (BP Location: Left Arm, Cuff Size: Normal)   Pulse 85   Temp 98 F (36.7 C) (Oral)   Ht 5' 8.4" (1.737 m)   Wt 170 lb 6.4 oz (77.3 kg)   SpO2 100%   BMI 25.61 kg/m  BP Readings from Last 3 Encounters:  02/12/23 (!) 140/72  02/06/23 (!) 178/97  10/28/22 120/60   Wt Readings from Last 3 Encounters:  02/12/23 170 lb 6.4 oz (77.3 kg)  10/28/22 172 lb 6.4 oz (78.2 kg)  10/22/21 166 lb 14.4 oz (75.7 kg)      Physical Exam Vitals reviewed.  Constitutional:      General: He is not in acute distress.    Appearance: Normal appearance. He is not ill-appearing.  Cardiovascular:     Rate and Rhythm: Normal rate and regular rhythm.  Pulmonary:     Effort: Pulmonary effort is normal.     Breath sounds: Normal breath sounds.  Musculoskeletal:     Comments: Fairly good range of motion right hip.  No right lateral hip tenderness.  Right knee reveals no effusion.  Good range of motion.  No warmth or erythema.  Right foot  and ankle are nontender to palpation.  No tenderness in the calf or lower leg region.  He does seem to grimace when palpating around the right mid to distal femur region.  Neurological:     Mental Status: He is alert.     No results found for any visits on 02/12/23.  {Labs (Optional):23779}  The ASCVD Risk score (Arnett DK, et al., 2019) failed to calculate for the following reasons:   The 2019 ASCVD risk score is only valid for ages 59 to 66    Assessment & Plan:   #1 right lower extremity pain.  Patient has advanced dementia and cannot give a good history.  No reported injuries whatsoever.  2-week duration.  No visible swelling.  Recent hip x-ray showed some osteoarthritis but no acute findings.  No evidence for pelvic  fracture.  Pain today seems to be more distal femur region.  -Obtain additional films including right knee and right femur -Discussed avoidance of nonsteroidals generally given his age and high risk -Continue Tylenol 500 mg 2 every 6 hours as needed  #2 elevated blood pressure.  Initial blood pressure 150/86.  Repeat after rest 140/72.  Blood pressure generally been controlled in the past.  Recommend close monitoring  #3 advanced dementia.  No significant behavioral disturbance.  Did not think he would benefit much from medication, if any, at this stage   No follow-ups on file.    Evelena Peat, MD

## 2023-02-12 NOTE — Patient Instructions (Signed)
We will call after x-ray is over-read  Continue with the Tylenol as needed  Monitor blood pressure and be in touch if consistently > 140/90.

## 2023-03-03 ENCOUNTER — Ambulatory Visit: Payer: Medicare Other | Admitting: Family Medicine

## 2023-03-03 ENCOUNTER — Encounter: Payer: Self-pay | Admitting: Family Medicine

## 2023-03-03 VITALS — BP 136/78 | HR 70 | Temp 98.2°F | Ht 68.5 in | Wt 172.2 lb

## 2023-03-03 DIAGNOSIS — E538 Deficiency of other specified B group vitamins: Secondary | ICD-10-CM

## 2023-03-03 DIAGNOSIS — K029 Dental caries, unspecified: Secondary | ICD-10-CM

## 2023-03-03 DIAGNOSIS — D649 Anemia, unspecified: Secondary | ICD-10-CM | POA: Diagnosis not present

## 2023-03-03 DIAGNOSIS — F03C0 Unspecified dementia, severe, without behavioral disturbance, psychotic disturbance, mood disturbance, and anxiety: Secondary | ICD-10-CM

## 2023-03-03 LAB — BASIC METABOLIC PANEL
BUN: 13 mg/dL (ref 6–23)
CO2: 29 meq/L (ref 19–32)
Calcium: 9.2 mg/dL (ref 8.4–10.5)
Chloride: 105 meq/L (ref 96–112)
Creatinine, Ser: 1.05 mg/dL (ref 0.40–1.50)
GFR: 66.68 mL/min (ref >60.00)
Glucose, Bld: 96 mg/dL (ref 70–99)
Potassium: 4.4 meq/L (ref 3.5–5.1)
Sodium: 138 meq/L (ref 135–145)

## 2023-03-03 LAB — VITAMIN B12: Vitamin B-12: 516 pg/mL (ref 211–911)

## 2023-03-03 LAB — CBC WITH DIFFERENTIAL/PLATELET
Basophils Absolute: 0 10*3/uL (ref 0.0–0.1)
Basophils Relative: 0.5 % (ref 0.0–3.0)
Eosinophils Absolute: 0.1 10*3/uL (ref 0.0–0.7)
Eosinophils Relative: 1.1 % (ref 0.0–5.0)
HCT: 37.9 % — ABNORMAL LOW (ref 39.0–52.0)
Hemoglobin: 12.6 g/dL — ABNORMAL LOW (ref 13.0–17.0)
Lymphocytes Relative: 21.1 % (ref 12.0–46.0)
Lymphs Abs: 1.2 10*3/uL (ref 0.7–4.0)
MCHC: 33.2 g/dL (ref 30.0–36.0)
MCV: 85.1 fL (ref 78.0–100.0)
Monocytes Absolute: 0.4 10*3/uL (ref 0.1–1.0)
Monocytes Relative: 7.4 % (ref 3.0–12.0)
Neutro Abs: 4 10*3/uL (ref 1.4–7.7)
Neutrophils Relative %: 69.9 % (ref 43.0–77.0)
Platelets: 300 10*3/uL (ref 150.0–400.0)
RBC: 4.46 Mil/uL (ref 4.22–5.81)
RDW: 15.5 % (ref 11.5–15.5)
WBC: 5.8 10*3/uL (ref 4.0–10.5)

## 2023-03-03 NOTE — Progress Notes (Signed)
Established Patient Office Visit  Subjective   Patient ID: Tony Leach, male    DOB: 1941/02/15  Age: 82 y.o. MRN: [id number]  Chief Complaint  Patient presents with   Annual Exam    HPI   Tony Leach is here accompanied by daughter today initially scheduled for "complete physical ".  However, he has Medicare primary and had Medicare wellness visit already.  We shifted our focus to chronic care issues.  We did review preventative issues and he has declined flu vaccine.  Also no history of shingles vaccine.  Chronic problems include history of dementia, reported BPH, glaucoma, hyperlipidemia.  Very supportive family.  Several family members basically take turns with fixing meals and he has lunch and supper provided daily.  He was seen recently for right lower extremity pain.  Imaging revealed no acute bony abnormalities.  After couple weeks his pain resolved.  He is back to walking without difficulty now. No recent reported falls  History of B12 deficiency.  Currently takes oral B12 1000 mcg daily. He does have history of mild normocytic anemia.  Guarding dementia does not take any medications.  Fortunately does not have any significant behavioral disturbance.  No longer drives.  Daughter relates he has excellent appetite and has gained about 6 pounds since last year.  No recent peripheral edema.  No reported difficulties with urination or stools.  Past Medical History:  Diagnosis Date   Cataract    Dementia (HCC)    Glaucoma    Hyperlipidemia    Past Surgical History:  Procedure Laterality Date   CATARACT EXTRACTION Bilateral    12/2021 & 01/2022   COLONOSCOPY     HERNIA REPAIR      reports that he has never smoked. He has never used smokeless tobacco. He reports that he does not drink alcohol and does not use drugs. family history includes Colon cancer in his brother; Heart disease in his father. No Known Allergies  ROS  Limited history available from patient secondary  to dementia.  Daughter relates no specific concerns.   Objective:     BP 136/78 (BP Location: Left Arm, Patient Position: Sitting, Cuff Size: Normal)   Pulse 70   Temp 98.2 F (36.8 C) (Oral)   Ht 5' 8.5" (1.74 m)   Wt 172 lb 3.2 oz (78.1 kg)   SpO2 99%   BMI 25.80 kg/m  BP Readings from Last 3 Encounters:  03/03/23 136/78  02/12/23 (!) 140/72  02/06/23 (!) 178/97   Wt Readings from Last 3 Encounters:  03/03/23 172 lb 3.2 oz (78.1 kg)  02/12/23 170 lb 6.4 oz (77.3 kg)  10/28/22 172 lb 6.4 oz (78.2 kg)      Physical Exam Vitals reviewed.  Constitutional:      General: He is not in acute distress.    Appearance: Normal appearance.  HENT:     Head: Normocephalic and atraumatic.     Right Ear: Tympanic membrane normal.     Left Ear: Tympanic membrane normal.     Mouth/Throat:     Comments: He has dental caries.  Very poor dentition throughout. Cardiovascular:     Rate and Rhythm: Normal rate and regular rhythm.     Heart sounds: No murmur heard.    No gallop.  Pulmonary:     Effort: Pulmonary effort is normal.     Breath sounds: No wheezing or rales.  Abdominal:     General: There is no distension.  Palpations: Abdomen is soft. There is no mass.     Tenderness: There is no abdominal tenderness. There is no guarding or rebound.  Musculoskeletal:     Cervical back: Neck supple.     Right lower leg: No edema.     Left lower leg: No edema.  Lymphadenopathy:     Cervical: No cervical adenopathy.  Neurological:     General: No focal deficit present.     Mental Status: He is alert.      No results found for any visits on 03/03/23.  Last vitamin B12 and Folate Lab Results  Component Value Date   VITAMINB12 213 10/22/2021      The ASCVD Risk score (Arnett DK, et al., 2019) failed to calculate for the following reasons:   The 2019 ASCVD risk score is only valid for ages 53 to 37    Assessment & Plan:   #1 Alzheimer's dementia without behavioral  disturbance.  Fairly advanced.  Do not think he would benefit from medication such as Aricept or Namenda at this stage.  Excellent family support.  #2 history of B12 deficiency.  No labs in over a year.  Recheck B12 level today.  Continue oral replacement unless indicated for IM replacement by labs  #3 history of mild normocytic anemia probably related to age.  Recheck CBC  #4 dental caries.  Significant tooth decay.  Family has decided not to put him through any further intervention there.  They focus on soft easy to chew foods which seems reasonable  #5 advance directives.  We discussed advance directives.  They currently do not have medical power of attorney or living will.  Someone has been appointed as a guardian.  Also discussed DNR  No follow-ups on file.    Evelena Peat, MD

## 2023-03-03 NOTE — Patient Instructions (Signed)
Consider Shingrix vaccine at local pharmacy.

## 2023-08-05 ENCOUNTER — Telehealth: Payer: Self-pay | Admitting: Internal Medicine

## 2023-11-01 ENCOUNTER — Ambulatory Visit (INDEPENDENT_AMBULATORY_CARE_PROVIDER_SITE_OTHER): Payer: Medicare Other

## 2023-11-01 VITALS — BP 124/62 | HR 70 | Temp 98.4°F | Ht 68.5 in | Wt 173.6 lb

## 2023-11-01 DIAGNOSIS — Z0000 Encounter for general adult medical examination without abnormal findings: Secondary | ICD-10-CM

## 2023-11-01 NOTE — Progress Notes (Addendum)
 Subjective:   ZAFAR DEBROSSE is a 83 y.o. who presents for a Medicare Wellness preventive visit.  As a reminder, Annual Wellness Visits don't include a physical exam, and some assessments may be limited, especially if this visit is performed virtually. We may recommend an in-person follow-up visit with your provider if needed.  Visit Complete: In person    Persons Participating in Visit: Patient and Daughter  AWV Questionnaire: No: Patient Medicare AWV questionnaire was not completed prior to this visit.  Cardiac Risk Factors include: advanced age (>1men, >38 women);male gender     Objective:    Today's Vitals   11/01/23 0908  BP: 124/62  Pulse: 70  Temp: 98.4 F (36.9 C)  TempSrc: Oral  SpO2: 95%  Weight: 173 lb 9.6 oz (78.7 kg)  Height: 5' 8.5 (1.74 m)   Body mass index is 26.01 kg/m.     11/01/2023    9:33 AM 10/28/2022   10:00 AM 10/22/2021   11:11 AM 02/14/2020    1:27 PM 07/05/2014    8:52 AM 06/21/2014    9:01 AM  Advanced Directives  Does Patient Have a Medical Advance Directive? No No No No No  No   Would patient like information on creating a medical advance directive? No - Patient declined No - Patient declined No - Patient declined No - Patient declined       Data saved with a previous flowsheet row definition    Current Medications (verified) Outpatient Encounter Medications as of 11/01/2023  Medication Sig   Cyanocobalamin  (VITAMIN B-12 PO) Take by mouth.   No facility-administered encounter medications on file as of 11/01/2023.    Allergies (verified) Patient has no known allergies.   History: Past Medical History:  Diagnosis Date   Cataract    Dementia (HCC)    Glaucoma    Hyperlipidemia    Past Surgical History:  Procedure Laterality Date   CATARACT EXTRACTION Bilateral    12/2021 & 01/2022   COLONOSCOPY     HERNIA REPAIR     Family History  Problem Relation Age of Onset   Heart disease Father    Colon cancer Brother     Esophageal cancer Neg Hx    Rectal cancer Neg Hx    Stomach cancer Neg Hx    Social History   Socioeconomic History   Marital status: Married    Spouse name: Not on file   Number of children: Not on file   Years of education: Not on file   Highest education level: Not on file  Occupational History   Not on file  Tobacco Use   Smoking status: Never   Smokeless tobacco: Never  Vaping Use   Vaping status: Never Used  Substance and Sexual Activity   Alcohol use: No   Drug use: Never   Sexual activity: Not on file  Other Topics Concern   Not on file  Social History Narrative   Not on file   Social Drivers of Health   Financial Resource Strain: Low Risk  (11/01/2023)   Overall Financial Resource Strain (CARDIA)    Difficulty of Paying Living Expenses: Not hard at all  Food Insecurity: No Food Insecurity (11/01/2023)   Hunger Vital Sign    Worried About Running Out of Food in the Last Year: Never true    Ran Out of Food in the Last Year: Never true  Transportation Needs: No Transportation Needs (11/01/2023)   PRAPARE - Transportation    Lack  of Transportation (Medical): No    Lack of Transportation (Non-Medical): No  Physical Activity: Sufficiently Active (11/01/2023)   Exercise Vital Sign    Days of Exercise per Week: 7 days    Minutes of Exercise per Session: 30 min  Stress: No Stress Concern Present (11/01/2023)   [employer] of Occupational Health - Occupational Stress Questionnaire    Feeling of Stress: Not at all  Social Connections: Moderately Integrated (11/01/2023)   Social Connection and Isolation Panel    Frequency of Communication with Friends and Family: More than three times a week    Frequency of Social Gatherings with Friends and Family: More than three times a week    Attends Religious Services: More than 4 times per year    Active Member of [employer] or Organizations: Yes    Attends Banker Meetings: More than 4 times per year    Marital  Status: Widowed    Tobacco Counseling Counseling given: Not Answered    Clinical Intake:  Pre-visit preparation completed: Yes  Pain : No/denies pain     BMI - recorded: 26.01 Nutritional Status: BMI 25 -29 Overweight Nutritional Risks: None Diabetes: No  No results found for: HGBA1C   How often do you need to have someone help you when you read instructions, pamphlets, or other written materials from your doctor or pharmacy?: 5 - Always (Daughter assist)  Interpreter Needed?: No  Information entered by :: Rojelio Blush LPN   Activities of Daily Living     11/01/2023    9:29 AM  In your present state of health, do you have any difficulty performing the following activities:  Hearing? 0  Vision? 0  Difficulty concentrating or making decisions? 1  Comment Daughter assist  Walking or climbing stairs? 0  Dressing or bathing? 1  Comment Daughter assist  Doing errands, shopping? 1  Comment Daughter Ship broker and eating ? Y  Comment Daughter assist  Using the Toilet? N  In the past six months, have you accidently leaked urine? N  Do you have problems with loss of bowel control? N  Managing your Medications? Y  Comment Daughter assist  Managing your Finances? Y  Comment Daughter assist  Housekeeping or managing your Housekeeping? Y  Comment Daughter assist    Patient Care Team: Micheal Wolm ORN, MD as PCP - General (Family Medicine)  I have updated your Care Teams any recent Medical Services you may have received from other providers in the past year.     Assessment:   This is a routine wellness examination for [name].  Hearing/Vision screen Hearing Screening - Comments:: Denies hearing difficulties   Vision Screening - Comments:: Wears rx glasses - up to date with routine eye exams with  Dr Octavia   Goals Addressed               This Visit's Progress     No current goals (pt-stated)         Depression Screen     11/01/2023     9:12 AM 10/28/2022    9:38 AM 10/22/2021   11:03 AM 06/11/2021    3:49 PM 02/14/2020    1:31 PM 10/17/2019    8:53 AM 10/17/2018   10:37 AM  PHQ 2/9 Scores  PHQ - 2 Score 0 0 0 0 0 0 0  PHQ- 9 Score    0 0 1 0    Fall Risk     11/01/2023  9:32 AM 10/28/2022    9:59 AM 10/22/2021   11:10 AM 06/11/2021    3:50 PM 02/14/2020    1:29 PM  Fall Risk   Falls in the past year? 0 0 0 0 0  Number falls in past yr: 0 0 0 0 0  Injury with Fall? 0 0 0 0 0  Risk for fall due to : No Fall Risks No Fall Risks No Fall Risks No Fall Risks No Fall Risks  Follow up Falls evaluation completed Falls prevention discussed   Falls evaluation completed;Falls prevention discussed      Data saved with a previous flowsheet row definition    MEDICARE RISK AT HOME:  Medicare Risk at Home Any stairs in or around the home?: No If so, are there any without handrails?: No Home free of loose throw rugs in walkways, pet beds, electrical cords, etc?: Yes Adequate lighting in your home to reduce risk of falls?: Yes Life alert?: No Use of a cane, walker or w/c?: No Grab bars in the bathroom?: No Shower chair or bench in shower?: No Elevated toilet seat or a handicapped toilet?: No  TIMED UP AND GO:  Was the test performed?  Yes  Length of time to ambulate 10 feet: 10 sec Gait slow and steady without use of assistive device  Cognitive Function: 6CIT completed        11/01/2023    9:33 AM 10/28/2022   10:00 AM 10/22/2021   11:11 AM  6CIT Screen  What Year? -- -- 4 points  What month? -- -- 3 points  What time? -- -- 3 points  Count back from 20 -- -- 4 points  Months in reverse -- -- 4 points  Repeat phrase -- -- 10 points  Total Score   28 points    Immunizations Immunization History  Administered Date(s) Administered   Pneumococcal Conjugate-13 07/23/2015   Pneumococcal Polysaccharide-23 10/17/2018   Td 05/14/1996, 12/07/2006    Screening Tests Health Maintenance  Topic Date Due   Zoster  Vaccines- Shingrix (1 of 2) Never done   DTaP/Tdap/Td (3 - Tdap) 12/06/2016   Colonoscopy  07/05/2019   COVID-19 Vaccine (1 - 2024-25 season) Never done   INFLUENZA VACCINE  11/12/2023   Medicare Annual Wellness (AWV)  10/31/2024   Pneumococcal Vaccine: 50+ Years  Completed   Hepatitis B Vaccines  Aged Out   HPV VACCINES  Aged Out   Meningococcal B Vaccine  Aged Out    Health Maintenance  Health Maintenance Due  Topic Date Due   Zoster Vaccines- Shingrix (1 of 2) Never done   DTaP/Tdap/Td (3 - Tdap) 12/06/2016   Colonoscopy  07/05/2019   COVID-19 Vaccine (1 - 2024-25 season) Never done   Health Maintenance Items Addressed:   Additional Screening:  Vision Screening: Recommended annual ophthalmology exams for early detection of glaucoma and other disorders of the eye. Would you like a referral to an eye doctor? No    Dental Screening: Recommended annual dental exams for proper oral hygiene  Community Resource Referral / Chronic Care Management: CRR required this visit?  No   CCM required this visit?  No   Plan:    I have personally reviewed and noted the following in the patient's chart:   Medical and social history Use of alcohol, tobacco or illicit drugs  Current medications and supplements including opioid prescriptions. Patient is not currently taking opioid prescriptions. Functional ability and status Nutritional status Physical activity Advanced directives List of  other physicians Hospitalizations, surgeries, and ER visits in previous 12 months Vitals Screenings to include cognitive, depression, and falls Referrals and appointments  In addition, I have reviewed and discussed with patient certain preventive protocols, quality metrics, and best practice recommendations. A written personalized care plan for preventive services as well as general preventive health recommendations were provided to patient.   Rojelio LELON Blush, LPN   [id number]   After Visit  Summary: (In Person-Printed) AVS printed and given to the patient  Notes: Nothing significant to report at this time.

## 2023-11-01 NOTE — Patient Instructions (Addendum)
 [name] , Thank you for taking time out of your busy schedule to complete your Annual Wellness Visit with me. I enjoyed our conversation and look forward to speaking with you again next year. I, as well as your care team,  appreciate your ongoing commitment to your health goals. Please review the following plan we discussed and let me know if I can assist you in the future. Your Game plan/ To Do List    Referrals: If you haven't heard from the office you've been referred to, please reach out to them at the phone provided.   Follow up Visits: Next Medicare AWV with our clinical staff: 11/06/24 @ 9:20a   Have you seen your provider in the last 6 months (3 months if uncontrolled diabetes)? 03/03/23 Next Office Visit with your provider:   Clinician Recommendations:  Aim for 30 minutes of exercise or brisk walking, 6-8 glasses of water, and 5 servings of fruits and vegetables each day.       This is a list of the screening recommended for you and due dates:  Health Maintenance  Topic Date Due   Zoster (Shingles) Vaccine (1 of 2) Never done   DTaP/Tdap/Td vaccine (3 - Tdap) 12/06/2016   Colon Cancer Screening  07/05/2019   COVID-19 Vaccine (1 - 2024-25 season) Never done   Flu Shot  11/12/2023   Medicare Annual Wellness Visit  10/31/2024   Pneumococcal Vaccine for age over 74  Completed   Hepatitis B Vaccine  Aged Out   HPV Vaccine  Aged Out   Meningitis B Vaccine  Aged Out    Advanced directives: (Declined) Advance directive discussed with you today. Even though you declined this today, please call our office should you change your mind, and we can give you the proper paperwork for you to fill out. Advance Care Planning is important because it:  [x]  Makes sure you receive the medical care that is consistent with your values, goals, and preferences  [x]  It provides guidance to your family and loved ones and reduces their decisional burden about whether or not they are making the right  decisions based on your wishes.  Follow the link provided in your after visit summary or read over the paperwork we have mailed to you to help you started getting your Advance Directives in place. If you need assistance in completing these, please reach out to us  so that we can help you!  See attachments for Preventive Care and Fall Prevention Tips.

## 2024-11-06 ENCOUNTER — Ambulatory Visit
